# Patient Record
Sex: Female | Born: 1950 | Race: White | Hispanic: No | Marital: Married | Smoking: Former smoker
Health system: Southern US, Community
[De-identification: ages and names within clinical notes are randomized; demographics above are authoritative.]

## PROBLEM LIST (undated history)

## (undated) DIAGNOSIS — Z923 Personal history of irradiation: Secondary | ICD-10-CM

## (undated) DIAGNOSIS — S43006A Unspecified dislocation of unspecified shoulder joint, initial encounter: Secondary | ICD-10-CM

## (undated) DIAGNOSIS — E039 Hypothyroidism, unspecified: Secondary | ICD-10-CM

## (undated) DIAGNOSIS — M1990 Unspecified osteoarthritis, unspecified site: Secondary | ICD-10-CM

## (undated) DIAGNOSIS — H409 Unspecified glaucoma: Secondary | ICD-10-CM

## (undated) DIAGNOSIS — C801 Malignant (primary) neoplasm, unspecified: Secondary | ICD-10-CM

## (undated) DIAGNOSIS — D649 Anemia, unspecified: Secondary | ICD-10-CM

## (undated) DIAGNOSIS — E079 Disorder of thyroid, unspecified: Secondary | ICD-10-CM

## (undated) DIAGNOSIS — K219 Gastro-esophageal reflux disease without esophagitis: Secondary | ICD-10-CM

## (undated) DIAGNOSIS — I10 Essential (primary) hypertension: Secondary | ICD-10-CM

## (undated) DIAGNOSIS — G629 Polyneuropathy, unspecified: Secondary | ICD-10-CM

## (undated) HISTORY — DX: Disorder of thyroid, unspecified: E07.9

## (undated) HISTORY — DX: Essential (primary) hypertension: I10

## (undated) HISTORY — DX: Polyneuropathy, unspecified: G62.9

## (undated) HISTORY — DX: Unspecified glaucoma: H40.9

## (undated) HISTORY — DX: Unspecified dislocation of unspecified shoulder joint, initial encounter: S43.006A

## (undated) HISTORY — PX: COLONOSCOPY WITH PROPOFOL: SHX5780

## (undated) HISTORY — PX: BACK SURGERY: SHX140

## (undated) HISTORY — DX: Anemia, unspecified: D64.9

---
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
## 1993-01-29 HISTORY — PX: BREAST BIOPSY: SHX20

## 2004-09-19 ENCOUNTER — Ambulatory Visit: Payer: Self-pay | Admitting: Internal Medicine

## 2005-09-20 ENCOUNTER — Ambulatory Visit: Payer: Self-pay | Admitting: Internal Medicine

## 2006-09-16 ENCOUNTER — Ambulatory Visit: Payer: Self-pay | Admitting: Internal Medicine

## 2007-10-01 ENCOUNTER — Ambulatory Visit: Payer: Self-pay | Admitting: Internal Medicine

## 2008-09-27 ENCOUNTER — Ambulatory Visit: Payer: Self-pay | Admitting: Internal Medicine

## 2008-10-13 ENCOUNTER — Ambulatory Visit: Payer: Self-pay | Admitting: Internal Medicine

## 2009-08-12 ENCOUNTER — Ambulatory Visit: Payer: Self-pay | Admitting: Internal Medicine

## 2010-09-21 ENCOUNTER — Ambulatory Visit: Payer: Self-pay | Admitting: Internal Medicine

## 2010-09-26 ENCOUNTER — Ambulatory Visit: Payer: Self-pay | Admitting: Internal Medicine

## 2011-09-24 ENCOUNTER — Ambulatory Visit: Payer: Self-pay | Admitting: Internal Medicine

## 2012-09-24 ENCOUNTER — Ambulatory Visit: Payer: Self-pay | Admitting: Internal Medicine

## 2013-09-25 ENCOUNTER — Ambulatory Visit: Payer: Self-pay | Admitting: Internal Medicine

## 2013-09-30 ENCOUNTER — Ambulatory Visit: Payer: Self-pay | Admitting: Internal Medicine

## 2014-02-11 DIAGNOSIS — C4492 Squamous cell carcinoma of skin, unspecified: Secondary | ICD-10-CM

## 2014-02-11 HISTORY — DX: Squamous cell carcinoma of skin, unspecified: C44.92

## 2014-04-01 ENCOUNTER — Ambulatory Visit: Payer: Self-pay | Admitting: Internal Medicine

## 2014-05-25 ENCOUNTER — Ambulatory Visit
Admit: 2014-05-25 | Disposition: A | Discharge status: Home / Self Care | Payer: Self-pay | Attending: Internal Medicine | Admitting: Internal Medicine

## 2014-09-15 ENCOUNTER — Other Ambulatory Visit: Payer: Self-pay | Admitting: Internal Medicine

## 2014-09-15 DIAGNOSIS — N651 Disproportion of reconstructed breast: Secondary | ICD-10-CM

## 2014-09-15 DIAGNOSIS — M51369 Other intervertebral disc degeneration, lumbar region without mention of lumbar back pain or lower extremity pain: Secondary | ICD-10-CM | POA: Insufficient documentation

## 2014-09-15 DIAGNOSIS — M5136 Other intervertebral disc degeneration, lumbar region: Secondary | ICD-10-CM | POA: Insufficient documentation

## 2014-09-28 ENCOUNTER — Ambulatory Visit
Admission: RE | Admit: 2014-09-28 | Discharge: 2014-09-28 | Disposition: A | Discharge status: Home / Self Care | Payer: BLUE CROSS/BLUE SHIELD | Source: Ambulatory Visit | Attending: Internal Medicine | Admitting: Internal Medicine

## 2014-09-28 DIAGNOSIS — N651 Disproportion of reconstructed breast: Secondary | ICD-10-CM

## 2014-09-28 DIAGNOSIS — N63 Unspecified lump in breast: Secondary | ICD-10-CM | POA: Insufficient documentation

## 2014-09-28 DIAGNOSIS — N6489 Other specified disorders of breast: Secondary | ICD-10-CM | POA: Insufficient documentation

## 2015-04-13 ENCOUNTER — Other Ambulatory Visit: Payer: Self-pay | Admitting: Internal Medicine

## 2015-04-13 DIAGNOSIS — N63 Unspecified lump in unspecified breast: Secondary | ICD-10-CM

## 2015-04-29 ENCOUNTER — Other Ambulatory Visit: Payer: Self-pay | Admitting: Internal Medicine

## 2015-04-29 ENCOUNTER — Ambulatory Visit
Admission: RE | Admit: 2015-04-29 | Discharge: 2015-04-29 | Disposition: A | Discharge status: Home / Self Care | Payer: Medicare PPO | Source: Ambulatory Visit | Attending: Internal Medicine | Admitting: Internal Medicine

## 2015-04-29 DIAGNOSIS — N6489 Other specified disorders of breast: Secondary | ICD-10-CM | POA: Insufficient documentation

## 2015-04-29 DIAGNOSIS — N63 Unspecified lump in unspecified breast: Secondary | ICD-10-CM

## 2015-09-20 ENCOUNTER — Other Ambulatory Visit: Payer: Self-pay | Admitting: Internal Medicine

## 2015-09-20 DIAGNOSIS — N6310 Unspecified lump in the right breast, unspecified quadrant: Secondary | ICD-10-CM

## 2015-10-28 ENCOUNTER — Ambulatory Visit
Admission: RE | Admit: 2015-10-28 | Discharge: 2015-10-28 | Disposition: A | Discharge status: Home / Self Care | Payer: Medicare PPO | Source: Ambulatory Visit | Attending: Internal Medicine | Admitting: Internal Medicine

## 2015-10-28 DIAGNOSIS — N63 Unspecified lump in breast: Secondary | ICD-10-CM | POA: Insufficient documentation

## 2015-10-28 DIAGNOSIS — N6489 Other specified disorders of breast: Secondary | ICD-10-CM | POA: Diagnosis not present

## 2015-10-28 DIAGNOSIS — N6310 Unspecified lump in the right breast, unspecified quadrant: Secondary | ICD-10-CM

## 2016-03-20 DIAGNOSIS — D649 Anemia, unspecified: Secondary | ICD-10-CM | POA: Insufficient documentation

## 2016-03-20 DIAGNOSIS — I10 Essential (primary) hypertension: Secondary | ICD-10-CM | POA: Insufficient documentation

## 2016-03-20 DIAGNOSIS — K2100 Gastro-esophageal reflux disease with esophagitis, without bleeding: Secondary | ICD-10-CM | POA: Insufficient documentation

## 2016-05-03 ENCOUNTER — Ambulatory Visit: Admit: 2016-05-03 | Payer: BLUE CROSS/BLUE SHIELD | Admitting: Unknown Physician Specialty

## 2016-05-03 SURGERY — ESOPHAGOGASTRODUODENOSCOPY (EGD) WITH PROPOFOL
Anesthesia: General

## 2016-06-07 ENCOUNTER — Encounter: Payer: Self-pay | Admitting: Oncology

## 2016-06-07 ENCOUNTER — Inpatient Hospital Stay: Payer: Medicare PPO

## 2016-06-07 ENCOUNTER — Inpatient Hospital Stay: Payer: Medicare PPO | Attending: Oncology | Admitting: Oncology

## 2016-06-07 ENCOUNTER — Encounter (INDEPENDENT_AMBULATORY_CARE_PROVIDER_SITE_OTHER): Payer: Self-pay

## 2016-06-07 VITALS — BP 123/72 | HR 58 | Temp 97.0°F | Ht 66.0 in | Wt 159.4 lb

## 2016-06-07 DIAGNOSIS — R5383 Other fatigue: Secondary | ICD-10-CM | POA: Diagnosis not present

## 2016-06-07 DIAGNOSIS — D649 Anemia, unspecified: Secondary | ICD-10-CM

## 2016-06-07 DIAGNOSIS — M5136 Other intervertebral disc degeneration, lumbar region: Secondary | ICD-10-CM

## 2016-06-07 DIAGNOSIS — G8929 Other chronic pain: Secondary | ICD-10-CM | POA: Diagnosis not present

## 2016-06-07 DIAGNOSIS — Z87891 Personal history of nicotine dependence: Secondary | ICD-10-CM | POA: Insufficient documentation

## 2016-06-07 DIAGNOSIS — H409 Unspecified glaucoma: Secondary | ICD-10-CM | POA: Insufficient documentation

## 2016-06-07 DIAGNOSIS — K648 Other hemorrhoids: Secondary | ICD-10-CM | POA: Diagnosis not present

## 2016-06-07 DIAGNOSIS — I10 Essential (primary) hypertension: Secondary | ICD-10-CM | POA: Insufficient documentation

## 2016-06-07 DIAGNOSIS — Z79899 Other long term (current) drug therapy: Secondary | ICD-10-CM | POA: Diagnosis not present

## 2016-06-07 DIAGNOSIS — Z7982 Long term (current) use of aspirin: Secondary | ICD-10-CM | POA: Diagnosis not present

## 2016-06-07 DIAGNOSIS — R7989 Other specified abnormal findings of blood chemistry: Secondary | ICD-10-CM | POA: Insufficient documentation

## 2016-06-07 DIAGNOSIS — M549 Dorsalgia, unspecified: Secondary | ICD-10-CM

## 2016-06-07 DIAGNOSIS — Z803 Family history of malignant neoplasm of breast: Secondary | ICD-10-CM | POA: Diagnosis not present

## 2016-06-07 DIAGNOSIS — N6019 Diffuse cystic mastopathy of unspecified breast: Secondary | ICD-10-CM | POA: Insufficient documentation

## 2016-06-07 DIAGNOSIS — G629 Polyneuropathy, unspecified: Secondary | ICD-10-CM | POA: Diagnosis not present

## 2016-06-07 DIAGNOSIS — K5790 Diverticulosis of intestine, part unspecified, without perforation or abscess without bleeding: Secondary | ICD-10-CM | POA: Insufficient documentation

## 2016-06-07 DIAGNOSIS — R5381 Other malaise: Secondary | ICD-10-CM | POA: Insufficient documentation

## 2016-06-07 DIAGNOSIS — K210 Gastro-esophageal reflux disease with esophagitis: Secondary | ICD-10-CM | POA: Diagnosis not present

## 2016-06-07 LAB — URINALYSIS, COMPLETE (UACMP) WITH MICROSCOPIC
BILIRUBIN URINE: NEGATIVE
Bacteria, UA: NONE SEEN
GLUCOSE, UA: NEGATIVE mg/dL
HGB URINE DIPSTICK: NEGATIVE
Ketones, ur: NEGATIVE mg/dL
NITRITE: NEGATIVE
PH: 7 (ref 5.0–8.0)
Protein, ur: NEGATIVE mg/dL
SPECIFIC GRAVITY, URINE: 1.003 — AB (ref 1.005–1.030)

## 2016-06-07 LAB — CBC WITH DIFFERENTIAL/PLATELET
BASOS ABS: 0 10*3/uL (ref 0–0.1)
BASOS PCT: 1 %
Eosinophils Absolute: 0.4 10*3/uL (ref 0–0.7)
Eosinophils Relative: 10 %
HEMATOCRIT: 32 % — AB (ref 35.0–47.0)
HEMOGLOBIN: 10.8 g/dL — AB (ref 12.0–16.0)
Lymphocytes Relative: 30 %
Lymphs Abs: 1.1 10*3/uL (ref 1.0–3.6)
MCH: 31.3 pg (ref 26.0–34.0)
MCHC: 33.8 g/dL (ref 32.0–36.0)
MCV: 92.6 fL (ref 80.0–100.0)
Monocytes Absolute: 0.3 10*3/uL (ref 0.2–0.9)
Monocytes Relative: 7 %
NEUTROS ABS: 1.9 10*3/uL (ref 1.4–6.5)
NEUTROS PCT: 52 %
Platelets: 245 10*3/uL (ref 150–440)
RBC: 3.45 MIL/uL — AB (ref 3.80–5.20)
RDW: 13.4 % (ref 11.5–14.5)
WBC: 3.7 10*3/uL (ref 3.6–11.0)

## 2016-06-07 LAB — COMPREHENSIVE METABOLIC PANEL
ALBUMIN: 4.4 g/dL (ref 3.5–5.0)
ALT: 27 U/L (ref 14–54)
AST: 23 U/L (ref 15–41)
Alkaline Phosphatase: 67 U/L (ref 38–126)
Anion gap: 5 (ref 5–15)
BILIRUBIN TOTAL: 0.6 mg/dL (ref 0.3–1.2)
BUN: 26 mg/dL — ABNORMAL HIGH (ref 6–20)
CO2: 29 mmol/L (ref 22–32)
Calcium: 9.7 mg/dL (ref 8.9–10.3)
Chloride: 101 mmol/L (ref 101–111)
Creatinine, Ser: 1.12 mg/dL — ABNORMAL HIGH (ref 0.44–1.00)
GFR calc non Af Amer: 50 mL/min — ABNORMAL LOW (ref >60)
GFR, EST AFRICAN AMERICAN: 58 mL/min — AB (ref >60)
GLUCOSE: 91 mg/dL (ref 65–99)
POTASSIUM: 4.3 mmol/L (ref 3.5–5.1)
Sodium: 135 mmol/L (ref 135–145)
TOTAL PROTEIN: 7.7 g/dL (ref 6.5–8.1)

## 2016-06-07 LAB — FERRITIN: Ferritin: 32 ng/mL (ref 11–307)

## 2016-06-07 LAB — IRON AND TIBC
IRON: 108 ug/dL (ref 28–170)
SATURATION RATIOS: 27 % (ref 10.4–31.8)
TIBC: 405 ug/dL (ref 250–450)
UIBC: 297 ug/dL

## 2016-06-07 LAB — TSH: TSH: 2.876 u[IU]/mL (ref 0.350–4.500)

## 2016-06-07 LAB — RETICULOCYTES
RBC.: 3.52 MIL/uL — AB (ref 3.80–5.20)
RETIC COUNT ABSOLUTE: 52.8 10*3/uL (ref 19.0–183.0)
RETIC CT PCT: 1.5 % (ref 0.4–3.1)

## 2016-06-07 LAB — SEDIMENTATION RATE: Sed Rate: 22 mm/hr (ref 0–30)

## 2016-06-07 LAB — VITAMIN B12: Vitamin B-12: 1087 pg/mL — ABNORMAL HIGH (ref 180–914)

## 2016-06-07 LAB — FOLATE: FOLATE: 14.5 ng/mL (ref >5.9)

## 2016-06-07 NOTE — Progress Notes (Signed)
Patient here for initial visit. Patient has experienced intermittent  Tiredness.

## 2016-06-07 NOTE — Progress Notes (Signed)
Hematology/Oncology Consult note [facility] Telephone:([phone] [phone]  No care team member to display   Name of the patient: Megan Briggs  [id number]  Sep 18, 1950    Reason for referral- anemia   Referring physician- Dr. Emily Filbert  Date of visit: 06/07/16   History of presenting illness- patient is a 66 year old female who was been referred to [place] for evaluation and management of anemia. Most recent CBC from 03/12/2016 showed white count of 3.7, H&H of 10.8/33.5 and platelet count is 230.Marland Kitchen Ferritin was low normal at 54. Folate was normal at 11.3. CMP was significant for creatinine of 1.3. She does [facility] and she has had a colonoscopy in 2004 as well as 2014 which showed diverticulosis and internal hemorrhoids. Patient states that her iron studies were done after she started taking oral iron as there was concern for iron deficiency anemia. Off note her MCV has always been normocytic. Her hemoglobin has always been between 10-11 since 2015 and her ferritin has been between 25-35 previously. She recently had egd which showed gastritis. Biopsies were negative. Patient has been off iron for about 2 months now. 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   ECOG PS- 0  Pain scale- 0   Review of systems- Review of Systems  Constitutional: Negative for chills, fever, malaise/fatigue and weight loss.  HENT: Negative for congestion, ear discharge and nosebleeds.   Eyes: Negative for blurred vision.  Respiratory: Negative for cough, hemoptysis, sputum production, shortness of breath and wheezing.   Cardiovascular: Negative for chest pain, palpitations, orthopnea and claudication.  Gastrointestinal: Negative for abdominal pain, blood in stool, constipation, diarrhea, heartburn, melena, nausea and vomiting.  Genitourinary: Negative for dysuria, flank pain, frequency, hematuria and urgency.    Musculoskeletal: Negative for back pain, joint pain and myalgias.  Skin: Negative for rash.  Neurological: Negative for dizziness, tingling, focal weakness, seizures, weakness and headaches.  Endo/Heme/Allergies: Does not bruise/bleed easily.  Psychiatric/Behavioral: Negative for depression and suicidal ideas. The patient does not have insomnia.     Allergies no known allergies  Patient Active Problem List   Diagnosis Date Noted  . Fibrocystic breast disease 06/07/2016  . Benign essential hypertension 03/20/2016  . Chronic anemia 03/20/2016  . Esophagitis, reflux 03/20/2016  . Degenerative disc disease, lumbar 09/15/2014     Past Medical History:  Diagnosis Date  . Anemia   . Glaucoma   . Hypertension   . Neuropathy      Past Surgical History:  Procedure Laterality Date  . BACK SURGERY    . BREAST BIOPSY Right 1995   benign    Social History   Social History  . Marital status: Married    Spouse name: N/A  . Number of children: N/A  . Years of education: N/A   Occupational History  . Not on file.   Social History Main Topics  . Smoking status: Former Smoker    Packs/day: 0.25    Quit date: 06/08/1970  . Smokeless tobacco: Never Used  . Alcohol use 1.8 oz/week    3 Glasses of wine per week  . Drug use: No  . Sexual activity: Not on file   Other Topics Concern  . Not on file   Social History Narrative  . No narrative on file     Family History  Problem Relation Age of Onset  . Breast cancer Cousin   . Breast cancer Cousin   .  Breast cancer Paternal Aunt      Current Outpatient Prescriptions:  .  acetaminophen (TYLENOL) 500 MG tablet, Take by mouth., Disp: , Rfl:  .  aspirin 81 MG chewable tablet, Chew by mouth., Disp: , Rfl:  .  brimonidine-timolol (COMBIGAN) 0.2-0.5 % ophthalmic solution, Place 1 drop into both eyes every 12 (twelve) hours., Disp: , Rfl:  .  gabapentin (NEURONTIN) 300 MG capsule, Take by mouth., Disp: , Rfl:  .   lisinopril-hydrochlorothiazide (PRINZIDE,ZESTORETIC) 20-12.5 MG tablet, , Disp: , Rfl:  .  meloxicam (MOBIC) 7.5 MG tablet, , Disp: , Rfl:  .  mometasone (ELOCON) 0.1 % cream, , Disp: , Rfl:  .  pantoprazole (PROTONIX) 40 MG tablet, , Disp: , Rfl:  .  polyethylene glycol powder (GLYCOLAX/MIRALAX) powder, , Disp: , Rfl:  .  RA VITAMIN E-VIT A & D 20000 units CREA, , Disp: , Rfl:  .  sucralfate (CARAFATE) 1 g tablet, , Disp: , Rfl:  .  TRAVATAN Z 0.004 % SOLN ophthalmic solution, , Disp: , Rfl:  .  vitamin B-12 (CYANOCOBALAMIN) 1000 MCG tablet, , Disp: , Rfl:    Physical exam:  Vitals:   06/07/16 1437  BP: 123/72  Pulse: (!) 58  Temp: 97 F (36.1 C)  TempSrc: Tympanic  SpO2: 99%  Weight: 159 lb 6.4 oz (72.3 kg)  Height: '5\' 6"'$  (1.676 m)   Physical Exam  Constitutional: She is oriented to person, place, and time and well-developed, well-nourished, and in no distress.  HENT:  Head: Normocephalic and atraumatic.  Eyes: EOM are normal. Pupils are equal, round, and reactive to light.  Neck: Normal range of motion.  Cardiovascular: Normal rate, regular rhythm and normal heart sounds.   Pulmonary/Chest: Effort normal and breath sounds normal.  Abdominal: Soft. Bowel sounds are normal.  Neurological: She is alert and oriented to person, place, and time.  Skin: Skin is warm and dry.  2-3 areas of bruising over left forearm from minor injury        Assessment and plan- Patient is a 66 y.o. female referred to [place] for normocytic anemia  Today I will obtain CBC with differential, CMP, ferritin and iron studies, B12, folate, ESR, TSH, reticulocyte count, haptoglobin and multiple myeloma panel. I will also check stool H pylori ag, cleiac panel and UA.  I will see the patient back in 2 weeks' time to discuss the results of her blood work and further management.   Thank you for this kind referral and the opportunity to participate in the care of this patient   Visit Diagnosis 1.  Normocytic anemia     Dr. Randa Evens, MD, MPH [facility] at [facility] Pager- [phone] 06/07/2016 3:27 PM

## 2016-06-08 ENCOUNTER — Other Ambulatory Visit: Payer: Self-pay | Admitting: *Deleted

## 2016-06-08 LAB — H. PYLORI ANTIBODY, IGG: H Pylori IgG: <0.8 Index Value (ref 0.00–0.79)

## 2016-06-08 LAB — HAPTOGLOBIN: Haptoglobin: 128 mg/dL (ref 34–200)

## 2016-06-11 LAB — MULTIPLE MYELOMA PANEL, SERUM
ALPHA2 GLOB SERPL ELPH-MCNC: 0.6 g/dL (ref 0.4–1.0)
Albumin SerPl Elph-Mcnc: 4.3 g/dL (ref 2.9–4.4)
Albumin/Glob SerPl: 1.7 (ref 0.7–1.7)
Alpha 1: 0.2 g/dL (ref 0.0–0.4)
B-GLOBULIN SERPL ELPH-MCNC: 0.9 g/dL (ref 0.7–1.3)
Gamma Glob SerPl Elph-Mcnc: 0.9 g/dL (ref 0.4–1.8)
Globulin, Total: 2.6 g/dL (ref 2.2–3.9)
IGG (IMMUNOGLOBIN G), SERUM: 869 mg/dL (ref 700–1600)
IGM, SERUM: 108 mg/dL (ref 26–217)
IgA: 119 mg/dL (ref 87–352)
Total Protein ELP: 6.9 g/dL (ref 6.0–8.5)

## 2016-06-21 ENCOUNTER — Inpatient Hospital Stay (HOSPITAL_BASED_OUTPATIENT_CLINIC_OR_DEPARTMENT_OTHER): Payer: Medicare PPO | Admitting: Oncology

## 2016-06-21 VITALS — BP 117/71 | HR 68 | Temp 97.5°F | Wt 156.6 lb

## 2016-06-21 DIAGNOSIS — I10 Essential (primary) hypertension: Secondary | ICD-10-CM

## 2016-06-21 DIAGNOSIS — G8929 Other chronic pain: Secondary | ICD-10-CM

## 2016-06-21 DIAGNOSIS — K5790 Diverticulosis of intestine, part unspecified, without perforation or abscess without bleeding: Secondary | ICD-10-CM

## 2016-06-21 DIAGNOSIS — K648 Other hemorrhoids: Secondary | ICD-10-CM | POA: Diagnosis not present

## 2016-06-21 DIAGNOSIS — R7989 Other specified abnormal findings of blood chemistry: Secondary | ICD-10-CM | POA: Diagnosis not present

## 2016-06-21 DIAGNOSIS — K210 Gastro-esophageal reflux disease with esophagitis: Secondary | ICD-10-CM | POA: Diagnosis not present

## 2016-06-21 DIAGNOSIS — M5136 Other intervertebral disc degeneration, lumbar region: Secondary | ICD-10-CM

## 2016-06-21 DIAGNOSIS — Z87891 Personal history of nicotine dependence: Secondary | ICD-10-CM

## 2016-06-21 DIAGNOSIS — Z7982 Long term (current) use of aspirin: Secondary | ICD-10-CM

## 2016-06-21 DIAGNOSIS — Z79899 Other long term (current) drug therapy: Secondary | ICD-10-CM

## 2016-06-21 DIAGNOSIS — D649 Anemia, unspecified: Secondary | ICD-10-CM

## 2016-06-21 DIAGNOSIS — G629 Polyneuropathy, unspecified: Secondary | ICD-10-CM | POA: Diagnosis not present

## 2016-06-21 DIAGNOSIS — R5381 Other malaise: Secondary | ICD-10-CM | POA: Diagnosis not present

## 2016-06-21 DIAGNOSIS — H409 Unspecified glaucoma: Secondary | ICD-10-CM | POA: Diagnosis not present

## 2016-06-21 DIAGNOSIS — M549 Dorsalgia, unspecified: Secondary | ICD-10-CM

## 2016-06-21 NOTE — Progress Notes (Signed)
Patient denies pain or discomfort at this time, vitals stable and documented.  Patient ambulates without assistance and brought to exam room 5.

## 2016-06-22 ENCOUNTER — Encounter: Payer: Self-pay | Admitting: Oncology

## 2016-06-22 NOTE — Progress Notes (Signed)
Hematology/Oncology Consult note [facility]  Telephone:([phone] Fax:([phone]  Patient Care Team: Rusty Aus, MD as PCP - General (Internal Medicine)   Name of the patient: Megan Briggs  [id number]  11-21-1950   Date of visit: 06/22/16  Diagnosis- normocytic anemia of unclear etiology  Chief complaint/ Reason for visit- discuss results of bloodwork  Heme/Onc history: patient is a 66 year old female who was been referred to [place] for evaluation and management of anemia. Most recent CBC from 03/12/2016 showed white count of 3.7, H&H of 10.8/33.5 and platelet count is 230.Marland Kitchen Ferritin was low normal at 54. Folate was normal at 11.3. CMP was significant for creatinine of 1.3. She does [facility] and she has had a colonoscopy in 2004 as well as 2014 which showed diverticulosis and internal hemorrhoids. Patient states that her iron studies were done after she started taking oral iron as there was concern for iron deficiency anemia. Off note her MCV has always been normocytic. Her hemoglobin has always been between 10-11 since 2015 and her ferritin has been between 25-35 previously. She recently had egd which showed gastritis. Biopsies were negative. Patient has been off iron for about 2 months now. 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   Results of blood work from 06/07/2016 were as follows: CBC showed white count of 3.7, H&H of 10.8/32 with an MCV of 92.6 and a platelet count of 245. CMP was normal except for mildly elevated creatinine of 1.1. Ferritin was low normal at 32 with iron studies were normal including an iron saturation of 27%. Urinalysis was negative for hematuria. B12 was elevated at 1087 patient takes oral B12 supplements). Folate and TSH was within normal limits. ESR was normal at 22. Multiple myeloma panel did not reveal any monoclonal gammopathy. Haptoglobin was normal at 128.   Interval  history- Patient reports mild fatigue. Denies other complaints  ECOG PS- 1 Pain scale- 0   Review of systems- Review of Systems  Constitutional: Positive for malaise/fatigue. Negative for chills, fever and weight loss.  HENT: Negative for congestion, ear discharge and nosebleeds.   Eyes: Negative for blurred vision.  Respiratory: Negative for cough, hemoptysis, sputum production, shortness of breath and wheezing.   Cardiovascular: Negative for chest pain, palpitations, orthopnea and claudication.  Gastrointestinal: Negative for abdominal pain, blood in stool, constipation, diarrhea, heartburn, melena, nausea and vomiting.  Genitourinary: Negative for dysuria, flank pain, frequency, hematuria and urgency.  Musculoskeletal: Negative for back pain, joint pain and myalgias.  Skin: Negative for rash.  Neurological: Negative for dizziness, tingling, focal weakness, seizures, weakness and headaches.  Endo/Heme/Allergies: Does not bruise/bleed easily.  Psychiatric/Behavioral: Negative for depression and suicidal ideas. The patient does not have insomnia.      Current treatment- observation  No Known Allergies   Past Medical History:  Diagnosis Date  . Anemia   . Glaucoma   . Hypertension   . Neuropathy      Past Surgical History:  Procedure Laterality Date  . BACK SURGERY    . BREAST BIOPSY Right 1995   benign    Social History   Social History  . Marital status: Married    Spouse name: N/A  . Number of children: N/A  . Years of education: N/A   Occupational History  . Not on file.   Social History Main Topics  . Smoking status: Former Smoker    Packs/day: 0.25  Quit date: 06/08/1970  . Smokeless tobacco: Never Used  . Alcohol use 1.8 oz/week    3 Glasses of wine per week  . Drug use: No  . Sexual activity: Not on file   Other Topics Concern  . Not on file   Social History Narrative  . No narrative on file    Family History  Problem Relation Age of  Onset  . Breast cancer Cousin   . Breast cancer Cousin   . Breast cancer Paternal Aunt      Current Outpatient Prescriptions:  .  acetaminophen (TYLENOL) 500 MG tablet, Take by mouth., Disp: , Rfl:  .  brimonidine-timolol (COMBIGAN) 0.2-0.5 % ophthalmic solution, Place 1 drop into both eyes every 12 (twelve) hours., Disp: , Rfl:  .  gabapentin (NEURONTIN) 300 MG capsule, Take by mouth., Disp: , Rfl:  .  latanoprost (XALATAN) 0.005 % ophthalmic solution, , Disp: , Rfl:  .  lisinopril-hydrochlorothiazide (PRINZIDE,ZESTORETIC) 20-12.5 MG tablet, , Disp: , Rfl:  .  meloxicam (MOBIC) 7.5 MG tablet, , Disp: , Rfl:  .  mometasone (ELOCON) 0.1 % cream, , Disp: , Rfl:  .  pantoprazole (PROTONIX) 40 MG tablet, , Disp: , Rfl:  .  polyethylene glycol powder (GLYCOLAX/MIRALAX) powder, , Disp: , Rfl:  .  RA VITAMIN E-VIT A & D 20000 units CREA, , Disp: , Rfl:  .  sucralfate (CARAFATE) 1 g tablet, , Disp: , Rfl:  .  TRAVATAN Z 0.004 % SOLN ophthalmic solution, , Disp: , Rfl:  .  vitamin B-12 (CYANOCOBALAMIN) 1000 MCG tablet, , Disp: , Rfl:  .  aspirin 81 MG chewable tablet, Chew by mouth., Disp: , Rfl:   Physical exam:  Vitals:   06/21/16 1531  BP: 117/71  Pulse: 68  Temp: 97.5 F (36.4 C)  TempSrc: Tympanic  Weight: 156 lb 9.6 oz (71 kg)   Physical Exam  Constitutional: She is oriented to person, place, and time and well-developed, well-nourished, and in no distress.  HENT:  Head: Normocephalic and atraumatic.  Eyes: EOM are normal. Pupils are equal, round, and reactive to light.  Neck: Normal range of motion.  Cardiovascular: Normal rate, regular rhythm and normal heart sounds.   Pulmonary/Chest: Effort normal and breath sounds normal.  Abdominal: Soft. Bowel sounds are normal.  Neurological: She is alert and oriented to person, place, and time.  Skin: Skin is warm and dry.     CMP Latest Ref Rng & Units 06/07/2016  Glucose 65 - 99 mg/dL 91  BUN 6 - 20 mg/dL 26(H)  Creatinine 0.44  - 1.00 mg/dL 1.12(H)  Sodium 135 - 145 mmol/L 135  Potassium 3.5 - 5.1 mmol/L 4.3  Chloride 101 - 111 mmol/L 101  CO2 22 - 32 mmol/L 29  Calcium 8.9 - 10.3 mg/dL 9.7  Total Protein 6.5 - 8.1 g/dL 7.7  Total Bilirubin 0.3 - 1.2 mg/dL 0.6  Alkaline Phos 38 - 126 U/L 67  AST 15 - 41 U/L 23  ALT 14 - 54 U/L 27   CBC Latest Ref Rng & Units 06/07/2016  WBC 3.6 - 11.0 K/uL 3.7  Hemoglobin 12.0 - 16.0 g/dL 10.8(L)  Hematocrit 35.0 - 47.0 % 32.0(L)  Platelets 150 - 440 K/uL 245     Assessment and plan- Patient is a 66 y.o. female referred to [place] for normocytic anemia of unclear etiology  I reviewed the results of anemia work up with the patient which are essentially unremarakable and there is no overt cause of  anemia evident on work up. She does have a low normal retic count suggestive of hypoproliferative anemia which could be from a bone marrow issue. Her hemoglobin has been stable between 10.8 to 11.8 over last 3 years. At this point I'm inclined to monitor this conservatively without doing a bone marrow biopsy. She will have repeat CBC checked with Dr. Sabra Heck at 3 months time which we will review. I will see her back in 6 months with repeat CBC and iron studies. If her hemoglobin were to consistently go down with time, I will consider a bone marrow biopsy. She has isolated anemia in the absence of a low white count and thrombocytopenia.    Visit Diagnosis 1. Normocytic anemia      Dr. Randa Evens, MD, MPH [facility] at [facility] Pager- [phone] 06/22/2016 11:52 AM

## 2016-07-13 ENCOUNTER — Other Ambulatory Visit: Payer: Self-pay | Admitting: Internal Medicine

## 2016-07-13 DIAGNOSIS — Z1231 Encounter for screening mammogram for malignant neoplasm of breast: Secondary | ICD-10-CM

## 2016-09-04 DIAGNOSIS — E782 Mixed hyperlipidemia: Secondary | ICD-10-CM | POA: Insufficient documentation

## 2016-10-29 ENCOUNTER — Ambulatory Visit
Admission: RE | Admit: 2016-10-29 | Discharge: 2016-10-29 | Disposition: A | Discharge status: Home / Self Care | Payer: Medicare PPO | Source: Ambulatory Visit | Attending: Internal Medicine | Admitting: Internal Medicine

## 2016-10-29 DIAGNOSIS — Z1231 Encounter for screening mammogram for malignant neoplasm of breast: Secondary | ICD-10-CM | POA: Insufficient documentation

## 2016-12-27 ENCOUNTER — Other Ambulatory Visit: Payer: Medicare PPO

## 2016-12-27 ENCOUNTER — Ambulatory Visit: Payer: Medicare PPO | Admitting: Oncology

## 2017-09-20 ENCOUNTER — Other Ambulatory Visit: Payer: Self-pay | Admitting: Internal Medicine

## 2017-09-20 DIAGNOSIS — Z1231 Encounter for screening mammogram for malignant neoplasm of breast: Secondary | ICD-10-CM

## 2017-10-09 ENCOUNTER — Ambulatory Visit
Admission: RE | Admit: 2017-10-09 | Discharge: 2017-10-09 | Disposition: A | Discharge status: Home / Self Care | Payer: Medicare PPO | Source: Ambulatory Visit | Attending: Internal Medicine | Admitting: Internal Medicine

## 2017-10-09 DIAGNOSIS — Z1231 Encounter for screening mammogram for malignant neoplasm of breast: Secondary | ICD-10-CM

## 2017-10-15 ENCOUNTER — Other Ambulatory Visit: Payer: Self-pay | Admitting: Internal Medicine

## 2017-10-15 DIAGNOSIS — R928 Other abnormal and inconclusive findings on diagnostic imaging of breast: Secondary | ICD-10-CM

## 2017-10-16 ENCOUNTER — Ambulatory Visit
Admission: RE | Admit: 2017-10-16 | Discharge: 2017-10-16 | Disposition: A | Discharge status: Home / Self Care | Payer: Medicare PPO | Source: Ambulatory Visit | Attending: Internal Medicine | Admitting: Internal Medicine

## 2017-10-16 DIAGNOSIS — R928 Other abnormal and inconclusive findings on diagnostic imaging of breast: Secondary | ICD-10-CM

## 2017-10-21 ENCOUNTER — Other Ambulatory Visit: Payer: Self-pay | Admitting: Internal Medicine

## 2017-10-21 DIAGNOSIS — N6320 Unspecified lump in the left breast, unspecified quadrant: Secondary | ICD-10-CM

## 2017-10-21 DIAGNOSIS — R928 Other abnormal and inconclusive findings on diagnostic imaging of breast: Secondary | ICD-10-CM

## 2017-10-29 DIAGNOSIS — Z923 Personal history of irradiation: Secondary | ICD-10-CM

## 2017-10-29 HISTORY — DX: Personal history of irradiation: Z92.3

## 2017-11-01 ENCOUNTER — Ambulatory Visit
Admission: RE | Admit: 2017-11-01 | Discharge: 2017-11-01 | Disposition: A | Discharge status: Home / Self Care | Payer: Medicare PPO | Source: Ambulatory Visit | Attending: Internal Medicine | Admitting: Internal Medicine

## 2017-11-01 DIAGNOSIS — R928 Other abnormal and inconclusive findings on diagnostic imaging of breast: Secondary | ICD-10-CM | POA: Diagnosis present

## 2017-11-01 DIAGNOSIS — N6320 Unspecified lump in the left breast, unspecified quadrant: Secondary | ICD-10-CM

## 2017-11-01 HISTORY — PX: BREAST BIOPSY: SHX20

## 2017-11-04 ENCOUNTER — Other Ambulatory Visit: Payer: Self-pay | Admitting: Internal Medicine

## 2017-11-04 LAB — SURGICAL PATHOLOGY

## 2017-11-05 ENCOUNTER — Encounter: Payer: Self-pay | Admitting: *Deleted

## 2017-11-05 NOTE — Progress Notes (Signed)
  Oncology Nurse Navigator Documentation  Navigator Location: [facility] (11/05/17 1500)   )Navigator Encounter Type: Introductory phone call (11/05/17 1500)   Abnormal Finding Date: 10/16/17 (11/05/17 1500) Confirmed Diagnosis Date: 11/01/17 (11/05/17 1500)                   Barriers/Navigation Needs: Coordination of Care (11/05/17 1500)                          Time Spent with Patient: 15 (11/05/17 1500)   Patient is newly diagnose with invasive breast cancer.  She is scheduled to see Dr. Lysle Pearl on 11/06/17.  I would like to schedule medical oncology per protocol.  Left message for patient to return my call.

## 2017-11-06 ENCOUNTER — Other Ambulatory Visit: Payer: Self-pay | Admitting: *Deleted

## 2017-11-06 DIAGNOSIS — C50919 Malignant neoplasm of unspecified site of unspecified female breast: Secondary | ICD-10-CM

## 2017-11-11 ENCOUNTER — Other Ambulatory Visit: Payer: Self-pay | Admitting: Surgery

## 2017-11-11 ENCOUNTER — Ambulatory Visit: Payer: Self-pay | Admitting: Surgery

## 2017-11-11 DIAGNOSIS — C50412 Malignant neoplasm of upper-outer quadrant of left female breast: Secondary | ICD-10-CM

## 2017-11-11 NOTE — H&P (Signed)
Subjective:   CC: Malignant neoplasm of upper-outer quadrant of left female breast, unspecified estrogen receptor status (CMS-HCC) [C50.412] HPI:  Megan Briggs is a 67 y.o. female who was referred by Megan Pax, MD for evaluation of above. Change was noted on last screening mammogram. Patient does routinely do self breast exams.  Patient has not noted a change on breast exam. Age of menarche was 33. Age of menopause was 1. Patient admits to hormonal therapy. Ten years. Patient is G1P1. Age of first live birth was 68. Patient did not breast feed. Patient denies nipple discharge. Patient denies previous breast biopsy. Patient denies a personal history of breast cancer.   Past Medical History:  has a past medical history of Fibrocystic breast disease and HTN (hypertension) (08/18/2013).  Past Surgical History:  has a past surgical history that includes Hx of breast biopsy (1994); Laminectomy Lumbar Spine (L5-S1 2016); Colonoscopy (05/22/2012, 04/17/2002); egd (05/03/2016); and Breast excisional biopsy (Left, 10/2017).last cscope in 2016.  Family History: family history includes Coronary Artery Disease (Blocked arteries around heart) in her father; Stroke in her other.  Patenal aunt 66s, cousins 64s/30s/40s, NHL and lung CA sister, pancreatic CA father.  Social History:  reports that she quit smoking about 47 years ago. She has never used smokeless tobacco. She reports that she drinks alcohol. Her drug history is not on file.  Current Medications: has a current medication list which includes the following prescription(s): acetaminophen, azelastine, bimatoprost, cholecalciferol, cyanocobalamin, docusate, gabapentin, loratadine, meloxicam, mometasone, olmesartan-hydrochlorothiazide, pantoprazole, polyethylene glycol, timolol maleate, triamcinolone, and turmeric (bulk).  Allergies:       Allergies as of 11/06/2017 - Reviewed 11/06/2017  Allergen Reaction Noted  . Ace inhibitors  Angioedema 05/24/2017    ROS:  A 15 point review of systems was performed and was negative except as noted in HPI   Objective:   BP 149/76   Pulse 62   Temp 37.2 C (99 F) (Oral)   Ht 165.1 cm (_0 )   Wt 71 kg (156 lb 8.4 oz)   BMI 26.05 kg/m   Constitutional :  alert, appears stated age, cooperative and no distress  Lymphatics/Throat:  no asymmetry, masses, or scars  Respiratory:  clear to auscultation bilaterally  Cardiovascular:  regular rate and rhythm  Gastrointestinal: soft, non-tender; bowel sounds normal; no masses,  no organomegaly.   Musculoskeletal: Steady gait and movement  Skin: Cool and moist  Psychiatric: Normal affect, non-agitated, not confused  Breast:  Chaperone present for exam.  breasts appear normal, no suspicious masses, no skin or nipple changes or axillary nodes.    LABS:  Surgical Pathology CASE: [id number] PATIENT: Megan Briggs Surgical Pathology Report     SPECIMEN SUBMITTED: A. Breast, left  CLINICAL HISTORY: Screening detected 6 mm mass with architectural distortion left UOQ  PRE-OPERATIVE DIAGNOSIS: IMC  POST-OPERATIVE DIAGNOSIS: Ribbon shaped tissue marker clip was deployed     DIAGNOSIS: A. BREAST, LEFT 2:30 5 CM FN; ULTRASOUND-GUIDED BIOPSY: - INVASIVE MAMMARY CARCINOMA, NO SPECIAL TYPE.  Size of invasive carcinoma: 1 mm in this sample Histologic grade of invasive carcinoma: Grade 1  Glandular/tubular differentiation score: 1  Nuclear pleomorphism score: 2  Mitotic rate score: 1  Total score: 4 Ductal carcinoma in situ: Not identified Lymphovascular invasion: Not identified  ER/PR/HER2: Immunohistochemistry will be deferred to an excision specimen as the carcinoma gets smaller on deeper levels.  Comment: The definitive grade will be assigned on the excisional specimen. These findings were communicated to [facility] in Dr.  Lawrence's office on 11/04/2017. Read back procedure was performed.  GROSS DESCRIPTION: A. Labeled: Left breast 230, 5 cm from nipple Received: In formalin Time/date in fixative: 8:45 AM on 11/01/2017 Cold ischemic time: Less than 1 minutes Total fixation time: 11.25 hours Core pieces: Multiple Size: Aggregate, 1.5 x 0.4 x 0.1 cm Description: Yellow-red cores and fragments Ink color: Blue Entirely submitted in one cassette.    Final Diagnosis performed by Megan Burow, MD. Electronically signed 11/04/2017 12:28:29PM The electronic signature indicates that the named Attending Pathologist has evaluated the specimen  Technical component performed at [facility], [address], [place], [place] [address] Lab: [phone] Dir: Megan Farmer, MD, MMMProfessional component performed at [facility], [facility], [place], [place], [place] [address] Lab: [phone] Dir: Megan Nims. Reuel Derby, MD      RADS: CLINICAL DATA:Screening recall for a left breast distortion.  EXAM: DIGITAL DIAGNOSTIC LEFT MAMMOGRAM WITH CAD AND TOMO  ULTRASOUND LEFT BREAST  COMPARISON:Previous exam(s).  ACR Breast Density Category c: The breast tissue is heterogeneously dense, which may obscure small masses.  FINDINGS: In the upper-outer aspect of the left breast, posterior depth there is a small mass measuring approximately 6 mm associated with calcifications and distortion.  Mammographic images were processed with CAD.  On physical exam, no suspicious palpable masses are identified in the lateral aspect of the left breast.  Ultrasound of the left breast at 230, 5 cm from the nipple demonstrates an irregular hypoechoic shadowing mass which is taller than wide measuring 6 x 5 x 4 mm. No blood flow seen within the mass on color Doppler imaging. Multiple normal-appearing lymph nodes are identified in the left axilla.  IMPRESSION: 1.There is a suspicious 6 mm mass in the  left breast at 2:30.  2.No evidence of left axillary lymphadenopathy.  RECOMMENDATION: 1. Ultrasound-guided biopsy is recommended for the left breast mass at 2:30. The patient is going on vacation tomorrow for the next 2 weeks. Per her request, she would like to schedule the biopsy after October 3rd.  I have discussed the findings and recommendations with the patient. Results were also provided in writing at the conclusion of the visit. If applicable, a reminder letter will be sent to the patient regarding the next appointment.  BI-RADS CATEGORY5: Highly suggestive of malignancy.   Electronically Signed By: MichelleCollins M.D. On: 10/16/2017 16:00  Assessment:   Malignant neoplasm of upper-outer quadrant of left female breast, unspecified estrogen receptor status (CMS-HCC) [C50.412]  Plan:   1. Malignant neoplasm of upper-outer quadrant of left female breast, unspecified estrogen receptor status (CMS-HCC) [C50.412]  Discussed the risk of surgery including recurrence, chronic pain, post-op infxn, poor/delayed wound healing, poor cosmesis, seroma, hematoma formation, and possible re-operation to address said risks. The risks of general anesthetic, if used, includes MI, CVA, sudden death or even reaction to anesthetic medications also discussed.  Typical post-op recovery time and possbility of activity restrictions were also discussed.  Alternatives include continued observation.  Benefits include possible symptom relief, pathologic evaluation, and/or curative excision.   The patient verbalized understanding and all questions were answered to the patient's satisfaction.  2. Patient has elected to proceed with surgical treatment. Procedure will be scheduled.  Written consent was obtained..     Electronically signed by Benjamine Sprague, DO on 11/08/2017 12:01 PM

## 2017-11-11 NOTE — H&P (View-Only) (Signed)
Subjective:   CC: Malignant neoplasm of upper-outer quadrant of left female breast, unspecified estrogen receptor status (CMS-HCC) [C50.412] HPI:  Megan Briggs is a 67 y.o. female who was referred by Megan Frederic Miller, MD for evaluation of above. Change was noted on last screening mammogram. Patient does routinely do self breast exams.  Patient has not noted a change on breast exam. Age of menarche was 13. Age of menopause was 53. Patient admits to hormonal therapy. Ten years. Patient is G1P1. Age of first live birth was 20. Patient did not breast feed. Patient denies nipple discharge. Patient denies previous breast biopsy. Patient denies a personal history of breast cancer.   Past Medical History:  has a past medical history of Fibrocystic breast disease and HTN (hypertension) (08/18/2013).  Past Surgical History:  has a past surgical history that includes Hx of breast biopsy (1994); Laminectomy Lumbar Spine (L5-S1 2016); Colonoscopy (05/22/2012, 04/17/2002); egd (05/03/2016); and Breast excisional biopsy (Left, 10/2017).last cscope in 2016.  Family History: family history includes Coronary Artery Disease (Blocked arteries around heart) in her father; Stroke in her other.  Patenal aunt 80s, cousins 20s/30s/40s, NHL and lung CA sister, pancreatic CA father.  Social History:  reports that she quit smoking about 47 years ago. She has never used smokeless tobacco. She reports that she drinks alcohol. Her drug history is not on file.  Current Medications: has a current medication list which includes the following prescription(s): acetaminophen, azelastine, bimatoprost, cholecalciferol, cyanocobalamin, docusate, gabapentin, loratadine, meloxicam, mometasone, olmesartan-hydrochlorothiazide, pantoprazole, polyethylene glycol, timolol maleate, triamcinolone, and turmeric (bulk).  Allergies:       Allergies as of 11/06/2017 - Reviewed 11/06/2017  Allergen Reaction Noted  . Ace inhibitors  Angioedema 05/24/2017    ROS:  A 15 point review of systems was performed and was negative except as noted in HPI   Objective:   BP 149/76   Pulse 62   Temp 37.2 C (99 F) (Oral)   Ht 165.1 cm (5' 5")   Wt 71 kg (156 lb 8.4 oz)   BMI 26.05 kg/m   Constitutional :  alert, appears stated age, cooperative and no distress  Lymphatics/Throat:  no asymmetry, masses, or scars  Respiratory:  clear to auscultation bilaterally  Cardiovascular:  regular rate and rhythm  Gastrointestinal: soft, non-tender; bowel sounds normal; no masses,  no organomegaly.   Musculoskeletal: Steady gait and movement  Skin: Cool and moist  Psychiatric: Normal affect, non-agitated, not confused  Breast:  Chaperone present for exam.  breasts appear normal, no suspicious masses, no skin or nipple changes or axillary nodes.    LABS:  Surgical Pathology CASE: [id number] PATIENT: Megan Briggs Surgical Pathology Report     SPECIMEN SUBMITTED: A. Breast, left  CLINICAL HISTORY: Screening detected 6 mm mass with architectural distortion left UOQ  PRE-OPERATIVE DIAGNOSIS: IMC  POST-OPERATIVE DIAGNOSIS: Ribbon shaped tissue marker clip was deployed     DIAGNOSIS: A. BREAST, LEFT 2:30 5 CM FN; ULTRASOUND-GUIDED BIOPSY: - INVASIVE MAMMARY CARCINOMA, NO SPECIAL TYPE.  Size of invasive carcinoma: 1 mm in this sample Histologic grade of invasive carcinoma: Grade 1  Glandular/tubular differentiation score: 1  Nuclear pleomorphism score: 2  Mitotic rate score: 1  Total score: 4 Ductal carcinoma in situ: Not identified Lymphovascular invasion: Not identified  ER/PR/HER2: Immunohistochemistry will be deferred to an excision specimen as the carcinoma gets smaller on deeper levels.  Comment: The definitive grade will be assigned on the excisional specimen. These findings were communicated to Megan Briggs in Dr.    Lawrence's office on 11/04/2017. Read back procedure was performed.  GROSS DESCRIPTION: A. Labeled: Left breast 230, 5 cm from nipple Received: In formalin Time/date in fixative: 8:45 AM on 11/01/2017 Cold ischemic time: Less than 1 minutes Total fixation time: 11.25 hours Core pieces: Multiple Size: Aggregate, 1.5 x 0.4 x 0.1 cm Description: Yellow-red cores and fragments Ink color: Blue Entirely submitted in one cassette.    Final Diagnosis performed by Quay Burow, MD. Electronically signed 11/04/2017 12:28:29PM The electronic signature indicates that the named Attending Pathologist has evaluated the specimen  Technical component performed at [facility], [address], [place], [place] [address] Lab: [phone] Dir: Rush Farmer, MD, MMMProfessional component performed at [facility], [facility], [place], [place], [place] [address] Lab: [phone] Dir: Dellia Nims. Reuel Derby, MD      RADS: CLINICAL DATA:Screening recall for a left breast distortion.  EXAM: DIGITAL DIAGNOSTIC LEFT MAMMOGRAM WITH CAD AND TOMO  ULTRASOUND LEFT BREAST  COMPARISON:Previous exam(s).  ACR Breast Density Category c: The breast tissue is heterogeneously dense, which may obscure small masses.  FINDINGS: In the upper-outer aspect of the left breast, posterior depth there is a small mass measuring approximately 6 mm associated with calcifications and distortion.  Mammographic images were processed with CAD.  On physical exam, no suspicious palpable masses are identified in the lateral aspect of the left breast.  Ultrasound of the left breast at 230, 5 cm from the nipple demonstrates an irregular hypoechoic shadowing mass which is taller than wide measuring 6 x 5 x 4 mm. No blood flow seen within the mass on color Doppler imaging. Multiple normal-appearing lymph nodes are identified in the left axilla.  IMPRESSION: 1.There is a suspicious 6 mm mass in the  left breast at 2:30.  2.No evidence of left axillary lymphadenopathy.  RECOMMENDATION: 1. Ultrasound-guided biopsy is recommended for the left breast mass at 2:30. The patient is going on vacation tomorrow for the next 2 weeks. Per her request, she would like to schedule the biopsy after October 3rd.  I have discussed the findings and recommendations with the patient. Results were also provided in writing at the conclusion of the visit. If applicable, a reminder letter will be sent to the patient regarding the next appointment.  BI-RADS CATEGORY5: Highly suggestive of malignancy.   Electronically Signed By: MichelleCollins M.D. On: 10/16/2017 16:00  Assessment:   Malignant neoplasm of upper-outer quadrant of left female breast, unspecified estrogen receptor status (CMS-HCC) [C50.412]  Plan:   1. Malignant neoplasm of upper-outer quadrant of left female breast, unspecified estrogen receptor status (CMS-HCC) [C50.412]  Discussed the risk of surgery including recurrence, chronic pain, post-op infxn, poor/delayed wound healing, poor cosmesis, seroma, hematoma formation, and possible re-operation to address said risks. The risks of general anesthetic, if used, includes MI, CVA, sudden death or even reaction to anesthetic medications also discussed.  Typical post-op recovery time and possbility of activity restrictions were also discussed.  Alternatives include continued observation.  Benefits include possible symptom relief, pathologic evaluation, and/or curative excision.   The patient verbalized understanding and all questions were answered to the patient's satisfaction.  2. Patient has elected to proceed with surgical treatment. Procedure will be scheduled.  Written consent was obtained..     Electronically signed by Benjamine Sprague, DO on 11/08/2017 12:01 PM

## 2017-11-12 ENCOUNTER — Encounter: Payer: Self-pay | Admitting: Oncology

## 2017-11-12 ENCOUNTER — Inpatient Hospital Stay: Payer: Medicare PPO | Attending: Oncology | Admitting: Oncology

## 2017-11-12 ENCOUNTER — Encounter: Payer: Self-pay | Admitting: *Deleted

## 2017-11-12 ENCOUNTER — Other Ambulatory Visit: Payer: Self-pay

## 2017-11-12 ENCOUNTER — Encounter
Admission: RE | Admit: 2017-11-12 | Discharge: 2017-11-12 | Disposition: A | Discharge status: Home / Self Care | Payer: Medicare PPO | Source: Ambulatory Visit | Attending: Surgery | Admitting: Surgery

## 2017-11-12 VITALS — BP 146/82 | HR 65 | Temp 97.6°F | Resp 18 | Ht 66.0 in | Wt 159.7 lb

## 2017-11-12 DIAGNOSIS — Z79899 Other long term (current) drug therapy: Secondary | ICD-10-CM

## 2017-11-12 DIAGNOSIS — Z803 Family history of malignant neoplasm of breast: Secondary | ICD-10-CM

## 2017-11-12 DIAGNOSIS — Z01818 Encounter for other preprocedural examination: Secondary | ICD-10-CM

## 2017-11-12 DIAGNOSIS — N6092 Unspecified benign mammary dysplasia of left breast: Secondary | ICD-10-CM | POA: Diagnosis present

## 2017-11-12 DIAGNOSIS — I10 Essential (primary) hypertension: Secondary | ICD-10-CM | POA: Diagnosis not present

## 2017-11-12 DIAGNOSIS — Z87891 Personal history of nicotine dependence: Secondary | ICD-10-CM | POA: Diagnosis not present

## 2017-11-12 DIAGNOSIS — Z808 Family history of malignant neoplasm of other organs or systems: Secondary | ICD-10-CM | POA: Diagnosis not present

## 2017-11-12 DIAGNOSIS — C50412 Malignant neoplasm of upper-outer quadrant of left female breast: Secondary | ICD-10-CM | POA: Insufficient documentation

## 2017-11-12 DIAGNOSIS — Z7189 Other specified counseling: Secondary | ICD-10-CM

## 2017-11-12 HISTORY — DX: Malignant (primary) neoplasm, unspecified: C80.1

## 2017-11-12 NOTE — Progress Notes (Signed)
Hematology/Oncology Consult note [facility] Telephone:([phone] [phone]  Patient Care Team: Rusty Aus, MD as PCP - General (Internal Medicine)   Name of the patient: Megan Briggs  [id number]  1950/12/30    Reason for referral-new diagnosis of breast cancer   Referring physician-Dr. Sabra Heck  Date of visit: 11/12/17   History of presenting illness- Patient is a 67 year old seen in the past for normocytic anemia.  She is now been referred to me for new diagnosis of breast cancer.  Patient had a screening mammogram on 10/16/2017 which showed suspicious mass in the upper outer quadrant of the left breast 6 x 5 x 4 mm in size which was also confirmed on ultrasound.  No evidence of left axillary adenopathy.  Patient underwent biopsy of the breast mass which showed grade 1, 1 mm invasive mammary carcinoma.  ER PR status was not obtained due to the small size of the breast biopsy.  Patient has already seen Dr. Lysle Pearl and is scheduled to undergo a lumpectomy on 11/14/2017.  Family history significant for non-Hodgkin's lymphoma and lung cancer in her sister.  Dad had pancreatic cancer at the age of 15.  Paternal first cousin had breast cancer in her 107s and maternal first cousin had breast cancer in her 107s.  Patient's paternal aunt had breast cancer in her 64s.  Patient has 1 daughter who had a melanoma 20 years ago.  She was on Prempro hormone replacement therapy for 20 years but is not on it right now.  She has had prior breast biopsies in 1994 as well as aspiration which was negative for malignancy.  She currently feels well and her appetite is normal.  Denies any unintentional weight loss or aches or pains anywhere.  ECOG PS- 0  Pain scale- 0   Review of systems- Review of Systems  Constitutional: Negative for chills, fever, malaise/fatigue and weight loss.  HENT: Negative for congestion, ear discharge and nosebleeds.   Eyes: Negative for blurred  vision.  Respiratory: Negative for cough, hemoptysis, sputum production, shortness of breath and wheezing.   Cardiovascular: Negative for chest pain, palpitations, orthopnea and claudication.  Gastrointestinal: Negative for abdominal pain, blood in stool, constipation, diarrhea, heartburn, melena, nausea and vomiting.  Genitourinary: Negative for dysuria, flank pain, frequency, hematuria and urgency.  Musculoskeletal: Negative for back pain, joint pain and myalgias.  Skin: Negative for rash.  Neurological: Negative for dizziness, tingling, focal weakness, seizures, weakness and headaches.  Endo/Heme/Allergies: Does not bruise/bleed easily.  Psychiatric/Behavioral: Negative for depression and suicidal ideas. The patient does not have insomnia.     Allergies  Allergen Reactions  . Ace Inhibitors Anaphylaxis and Swelling    Lisinopril   . Tape Other (See Comments)    Band-aids caused redness and irritation    Patient Active Problem List   Diagnosis Date Noted  . Malignant neoplasm of upper-outer quadrant of left female breast ([place]) 11/12/2017  . Fibrocystic breast disease 06/07/2016  . Benign essential hypertension 03/20/2016  . Chronic anemia 03/20/2016  . Esophagitis, reflux 03/20/2016  . Degenerative disc disease, lumbar 09/15/2014     Past Medical History:  Diagnosis Date  . Anemia   . Glaucoma   . Hypertension   . Neuropathy      Past Surgical History:  Procedure Laterality Date  . BACK SURGERY    . BREAST BIOPSY Right 1995   benign  . BREAST BIOPSY Left 11/01/2017   US guided biopsy - ribbon clip  Social History   Socioeconomic History  . Marital status: Married    Spouse name: Not on file  . Number of children: Not on file  . Years of education: Not on file  . Highest education level: Not on file  Occupational History  . Not on file  Social Needs  . Financial resource strain: Not on file  . Food insecurity:    Worry: Not on file    Inability: Not  on file  . Transportation needs:    Medical: Not on file    Non-medical: Not on file  Tobacco Use  . Smoking status: Former Smoker    Packs/day: 0.25    Last attempt to quit: 06/08/1970    Years since quitting: 47.4  . Smokeless tobacco: Never Used  Substance and Sexual Activity  . Alcohol use: Yes    Alcohol/week: 3.0 standard drinks    Types: 3 Glasses of wine per week  . Drug use: No  . Sexual activity: Not on file  Lifestyle  . Physical activity:    Days per week: Not on file    Minutes per session: Not on file  . Stress: Not on file  Relationships  . Social connections:    Talks on phone: Not on file    Gets together: Not on file    Attends religious service: Not on file    Active member of club or organization: Not on file    Attends meetings of clubs or organizations: Not on file    Relationship status: Not on file  . Intimate partner violence:    Fear of current or ex partner: Not on file    Emotionally abused: Not on file    Physically abused: Not on file    Forced sexual activity: Not on file  Other Topics Concern  . Not on file  Social History Narrative  . Not on file     Family History  Problem Relation Age of Onset  . Breast cancer Cousin   . Breast cancer Cousin   . Breast cancer Paternal Aunt      Current Outpatient Medications:  .  azelastine (ASTELIN) 0.1 % nasal spray, Place 1 spray into both nostrils 2 (two) times daily. Use in each nostril as directed, Disp: , Rfl:  .  bimatoprost (LUMIGAN) 0.01 % SOLN, Place 1 drop into both eyes at bedtime., Disp: , Rfl:  .  Cholecalciferol (VITAMIN D) 2000 units tablet, Take 2,000 Units by mouth daily., Disp: , Rfl:  .  gabapentin (NEURONTIN) 300 MG capsule, Take 600 mg by mouth at bedtime. , Disp: , Rfl:  .  meloxicam (MOBIC) 7.5 MG tablet, Take 7.5 mg by mouth daily. , Disp: , Rfl:  .  olmesartan-hydrochlorothiazide (BENICAR HCT) 20-12.5 MG tablet, Take 0.5 tablets by mouth daily., Disp: , Rfl:  .   pantoprazole (PROTONIX) 40 MG tablet, Take 40 mg by mouth daily. , Disp: , Rfl:  .  timolol (BETIMOL) 0.5 % ophthalmic solution, Place 1 drop into both eyes 2 (two) times daily., Disp: , Rfl:  .  Turmeric 500 MG CAPS, Take 1 capsule by mouth daily., Disp: , Rfl:  .  vitamin B-12 (CYANOCOBALAMIN) 1000 MCG tablet, Take 1,000 mcg by mouth daily. , Disp: , Rfl:  .  acetaminophen (TYLENOL) 500 MG tablet, Take 1,000 mg by mouth at bedtime. , Disp: , Rfl:  .  sennosides-docusate sodium (SENOKOT-S) 8.6-50 MG tablet, Take 2 tablets by mouth at bedtime., Disp: , Rfl:  Physical exam:  Vitals:   11/12/17 1010  BP: (!) 146/82  Pulse: 65  Resp: 18  Temp: 97.6 F (36.4 C)  TempSrc: Tympanic  SpO2: 97%  Weight: 159 lb 11.2 oz (72.4 kg)  Height: '5\' 6"'$  (1.676 m)   Physical Exam  Constitutional: She is oriented to person, place, and time. She appears well-developed and well-nourished.  HENT:  Head: Normocephalic and atraumatic.  Eyes: Pupils are equal, round, and reactive to light. EOM are normal.  Neck: Normal range of motion.  Cardiovascular: Normal rate, regular rhythm and normal heart sounds.  Pulmonary/Chest: Effort normal and breath sounds normal.  Abdominal: Soft. Bowel sounds are normal.  Neurological: She is alert and oriented to person, place, and time.  Skin: Skin is warm and dry.  Breast exam performed in sitting and lying down position.  No evidence of palpable bilateral axillary adenopathy.  No evidence of bilateral palpable breast masses    CMP Latest Ref Rng & Units 06/07/2016  Glucose 65 - 99 mg/dL 91  BUN 6 - 20 mg/dL 26(H)  Creatinine 0.44 - 1.00 mg/dL 1.12(H)  Sodium 135 - 145 mmol/L 135  Potassium 3.5 - 5.1 mmol/L 4.3  Chloride 101 - 111 mmol/L 101  CO2 22 - 32 mmol/L 29  Calcium 8.9 - 10.3 mg/dL 9.7  Total Protein 6.5 - 8.1 g/dL 7.7  Total Bilirubin 0.3 - 1.2 mg/dL 0.6  Alkaline Phos 38 - 126 U/L 67  AST 15 - 41 U/L 23  ALT 14 - 54 U/L 27   CBC Latest Ref Rng &  Units 06/07/2016  WBC 3.6 - 11.0 K/uL 3.7  Hemoglobin 12.0 - 16.0 g/dL 10.8(L)  Hematocrit 35.0 - 47.0 % 32.0(L)  Platelets 150 - 440 K/uL 245    No images are attached to the encounter.  US Breast Ltd Uni Left Inc Axilla  Result Date: 10/16/2017 CLINICAL DATA:  Screening recall for a left breast distortion. EXAM: DIGITAL DIAGNOSTIC LEFT MAMMOGRAM WITH CAD AND TOMO ULTRASOUND LEFT BREAST COMPARISON:  Previous exam(s). ACR Breast Density Category c: The breast tissue is heterogeneously dense, which may obscure small masses. FINDINGS: In the upper-outer aspect of the left breast, posterior depth there is a small mass measuring approximately 6 mm associated with calcifications and distortion. Mammographic images were processed with CAD. On physical exam, no suspicious palpable masses are identified in the lateral aspect of the left breast. Ultrasound of the left breast at 230, 5 cm from the nipple demonstrates an irregular hypoechoic shadowing mass which is taller than wide measuring 6 x 5 x 4 mm. No blood flow seen within the mass on color Doppler imaging. Multiple normal-appearing lymph nodes are identified in the left axilla. IMPRESSION: 1.  There is a suspicious 6 mm mass in the left breast at 2:30. 2.  No evidence of left axillary lymphadenopathy. RECOMMENDATION: 1. Ultrasound-guided biopsy is recommended for the left breast mass at 2:30. The patient is going on vacation tomorrow for the next 2 weeks. Per her request, she would like to schedule the biopsy after October 3rd. I have discussed the findings and recommendations with the patient. Results were also provided in writing at the conclusion of the visit. If applicable, a reminder letter will be sent to the patient regarding the next appointment. BI-RADS CATEGORY  5: Highly suggestive of malignancy. Electronically Signed   By: Ammie Ferrier M.D.   On: 10/16/2017 16:00   Mm Diag Breast Tomo Uni Left  Result Date: 10/16/2017 CLINICAL DATA:  Screening recall for a left breast distortion. EXAM: DIGITAL DIAGNOSTIC LEFT MAMMOGRAM WITH CAD AND TOMO ULTRASOUND LEFT BREAST COMPARISON:  Previous exam(s). ACR Breast Density Category c: The breast tissue is heterogeneously dense, which may obscure small masses. FINDINGS: In the upper-outer aspect of the left breast, posterior depth there is a small mass measuring approximately 6 mm associated with calcifications and distortion. Mammographic images were processed with CAD. On physical exam, no suspicious palpable masses are identified in the lateral aspect of the left breast. Ultrasound of the left breast at 230, 5 cm from the nipple demonstrates an irregular hypoechoic shadowing mass which is taller than wide measuring 6 x 5 x 4 mm. No blood flow seen within the mass on color Doppler imaging. Multiple normal-appearing lymph nodes are identified in the left axilla. IMPRESSION: 1.  There is a suspicious 6 mm mass in the left breast at 2:30. 2.  No evidence of left axillary lymphadenopathy. RECOMMENDATION: 1. Ultrasound-guided biopsy is recommended for the left breast mass at 2:30. The patient is going on vacation tomorrow for the next 2 weeks. Per her request, she would like to schedule the biopsy after October 3rd. I have discussed the findings and recommendations with the patient. Results were also provided in writing at the conclusion of the visit. If applicable, a reminder letter will be sent to the patient regarding the next appointment. BI-RADS CATEGORY  5: Highly suggestive of malignancy. Electronically Signed   By: Ammie Ferrier M.D.   On: 10/16/2017 16:00   Mm Clip Placement Left  Result Date: 11/01/2017 CLINICAL DATA:  Confirmation of clip placement after ultrasound-guided core needle biopsy of a 6 mm mass associated with architectural distortion in the UPPER OUTER QUADRANT of the LEFT breast at POSTERIOR depth. EXAM: DIAGNOSTIC LEFT MAMMOGRAM POST ULTRASOUND BIOPSY COMPARISON:  Previous exam(s).  FINDINGS: Mammographic images were obtained following ultrasound guided biopsy of a mass in the [place] of the LEFT breast at POSTERIOR depth. The ribbon shaped tissue marker clip migrated approximately 7 mm anteromedial to the biopsied mass in the [place]. Expected post biopsy changes are present without evidence of hematoma. IMPRESSION: Approximate 7 mm anteromedial migration of the ribbon shaped tissue marker clip relative to the biopsied mass in the UPPER OUTER QUADRANT of the LEFT breast at POSTERIOR depth. Final Assessment: Post Procedure Mammograms for Marker Placement Electronically Signed   By: Evangeline Dakin M.D.   On: 11/01/2017 09:17   [place] Lt Breast Bx W Loc Dev 1st Lesion Img Bx Spec US Guide  Addendum Date: 11/04/2017   ADDENDUM REPORT: 11/04/2017 13:50 ADDENDUM: PATHOLOGY ADDENDUM: Pathology: Invasive mammary carcinoma Pathology concordant with imaging findings: Yes Recommendation: Surgical excision Localization/excision considerations: Wire localization Per Dr. Ammie Ferrier preference, the pathology results will be communicated by his nurse Glenda. Electronically Signed   By: Fidela Salisbury M.D.   On: 11/04/2017 13:50   Result Date: 11/04/2017 CLINICAL DATA:  Screening detected 6 mm mass associated with architectural distortion involving the UPPER OUTER QUADRANT of the LEFT breast at POSTERIOR depth, 2:30 o'clock approximately 5 cm from the nipple. EXAM: ULTRASOUND GUIDED LEFT BREAST CORE NEEDLE BIOPSY COMPARISON:  Previous exam(s). FINDINGS: I met with the patient and we discussed the procedure of ultrasound-guided biopsy, including benefits and alternatives. We discussed the high likelihood of a successful procedure. We discussed the risks of the procedure, including infection, bleeding, tissue injury, clip migration, and inadequate sampling. Informed written consent was given. The usual time-out protocol was performed immediately prior  to the procedure. Lesion  quadrant: UPPER OUTER QUADRANT. Using sterile technique with chlorhexidine as skin antisepsis, 1% Lidocaine as local anesthetic, under direct ultrasound visualization, a 12 gauge Bard Marquee core needle device was used to perform biopsy of the mass in the UPPER OUTER QUADRANT of the LEFT breast using a LATERAL approach. At the conclusion of the procedure a ribbon shaped tissue marker clip was deployed into the biopsy cavity. Follow up 2 view mammogram was performed and dictated separately. IMPRESSION: Ultrasound guided biopsy of a suspicious 6 mm mass involving the UPPER OUTER QUADRANT of the LEFT breast. No apparent complications. Electronically Signed: By: Evangeline Dakin M.D. On: 11/01/2017 09:16    Assessment and plan- Patient is a 66 y.o. female with newly diagnosed invasive mammary carcinoma of the left breast clinical prognostic stage IA cT1b cN0 MX on core biopsy  I discussed the results of the mammogram ultrasound and pathology with the patient in detail.  Overall patient found to have a small grade 1 invasive mammary carcinoma which appears to be about 6 mm on mammogram.  ER PR status was not done on core biopsy as it was a small 1 mm specimen.  She is due to undergo lumpectomy and sentinel lymph node biopsy on 11/14/2017.  I will see her back in 10 days time to discuss the results of final pathology and further management.  Of overall the size of the tumor is less than 10 mm and grade 1 on final pathology she would not require Oncotype testing and therefore not require adjuvant chemotherapy.  I will await the results of ER status on her final pathology to see if there would be a role for hormone therapy.  Given that she had a grade 1 tumor it is likely that she would be ER PR positive and HER-2/neu negative.  Treatment will be given with a curative intent  She would also see radiation oncology on the same day she sees me next week.  I will also refer her for genetic testing given extensive  family history as above   Total face to face encounter time for this patient visit was 45 min. >50% of the time was  spent in counseling and coordination of care.    Thank you for this kind referral and the opportunity to participate in the care of this patient   Visit Diagnosis 1. Malignant neoplasm of upper-outer quadrant of left female breast, unspecified estrogen receptor status ([place])   2. Goals of care, counseling/discussion     Dr. Randa Evens, MD, MPH [facility] [phone] 11/12/2017  2:02 PM

## 2017-11-12 NOTE — Patient Instructions (Signed)
Your procedure is scheduled on: Thursday 11/14/17.  Report to DAY SURGERY DEPARTMENT LOCATED ON 2ND FLOOR MEDICAL MALL ENTRANCE.   Remember: Instructions that are not followed completely may result in serious medical risk, up to and including death, or upon the discretion of your surgeon and anesthesiologist your surgery may need to be rescheduled.     _X__ 1. Do not eat food after midnight the night before your procedure.                 No gum chewing or hard candies. You may drink clear liquids up to 2 hours                 before you are scheduled to arrive for your surgery- DO NOT drink clear                 liquids within 2 hours of the start of your surgery.                 Clear Liquids include:  water, apple juice without pulp, clear carbohydrate                 drink such as Clearfast or Gatorade, Black Coffee or Tea (Do not add                 anything to coffee or tea).  __X__2.  On the morning of surgery brush your teeth with toothpaste and water, you may rinse your mouth with mouthwash if you wish.  Do not swallow any toothpaste of mouthwash.     _X__ 3.  No Alcohol for 24 hours before or after surgery.   _X__ 4.  Do Not Smoke or use e-cigarettes For 24 Hours Prior to Your Surgery.                 Do not use any chewable tobacco products for at least 6 hours prior to                 surgery.  __X__5.  Notify your doctor if there is any change in your medical condition      (cold, fever, infections).      Do not wear jewelry, make-up, hairpins, clips or nail polish. Do not wear lotions, powders, or perfumes.   Do not shave 48 hours prior to surgery. Men may shave face and neck. Do not bring valuables to the hospital.    [facility] is not responsible for any belongings or valuables.  Contacts, dentures/partials or body piercings may not be worn into surgery. Bring a case for your contacts, glasses or hearing aids, a denture cup will be supplied. Leave your suitcase in  the car. After surgery it may be brought to your room. For patients admitted to the hospital, discharge time is determined by your treatment team.   Patients discharged the day of surgery will not be allowed to drive home.   Please read over the following fact sheets that you were given:   MRSA Information  __X__ Take these medicines the morning of surgery with A SIP OF WATER:     1. acetaminophen (TYLENOL) 500 MG tablet  2. azelastine (ASTELIN) 0.1 % nasal spray  3. pantoprazole (PROTONIX) 40 MG tablet   4. timolol (BETIMOL) 0.5 % ophthalmic solution  5.   6.     __X__ Use CHG Soap as directed   __X__ Stop Anti-inflammatories 7 days before surgery such as Advil, Ibuprofen, Motrin, BC or  Goodies Powder, Naprosyn, Naproxen, Aleve, Aspirin, Meloxicam. May take Tylenol if needed for pain or discomfort.    __X__ Stop all herbal supplements. Turmeric 500 MG CAPS

## 2017-11-12 NOTE — Progress Notes (Signed)
  Oncology Nurse Navigator Documentation  Navigator Location: [facility] (11/12/17 1100) Referral date to RadOnc/MedOnc: 11/12/17 (11/12/17 1100) )Navigator Encounter Type: Initial MedOnc (11/12/17 1100)       Surgery Date: 11/14/17 (11/12/17 1100) Genetic Counseling Date: 11/12/17 (11/12/17 1100) Genetic Counseling Type: Non-Urgent (11/12/17 1100)         Patient Visit Type: MedOnc (11/12/17 1100) Treatment Phase: Pre-Tx/Tx Discussion (11/12/17 1100) Barriers/Navigation Needs: Education (11/12/17 1100) Education: Understanding Cancer/ Treatment Options;Coping with Diagnosis/ Prognosis (11/12/17 1100) Interventions: Education (11/12/17 1100)                      Time Spent with Patient: 45 (11/12/17 1100)   Met patient and her husband during her initial medical oncology consult with Dr. Janese Banks.  Patient is newly diagnosed with a very small invasive left breast cancer.  She is planning for lumpectomy on Thursday.  ER/PR/Her2 will be completed on the final pathology.  Patient is scheduled to return to see Dr. Janese Banks next Thursday for final treatment planning.  Patient was given Gave patient breast cancer educational literature, "My Breast Cancer Treatment Handbook" by Josephine Igo, RN at Dr. Ines Bloomer office.  She is to call if she has any questions or needs.

## 2017-11-12 NOTE — Pre-Procedure Instructions (Signed)
Surgical consent was not signed in PAT. The patient was not comfortable with the term "partial mastectomy". She was familiar with the term "lumpectomy" that had been used in conversation with Dr. Lysle Pearl. I called and spoke with Vicky at Dr. Ines Bloomer office to let them know.

## 2017-11-13 MED ORDER — CEFAZOLIN SODIUM-DEXTROSE 2-4 GM/100ML-% IV SOLN
2.0000 g | INTRAVENOUS | Status: AC
  Administered 2017-11-14: 2 g via INTRAVENOUS

## 2017-11-14 ENCOUNTER — Ambulatory Visit
Admission: RE | Admit: 2017-11-14 | Discharge: 2017-11-14 | Disposition: A | Discharge status: Home / Self Care | Payer: Medicare PPO | Source: Ambulatory Visit | Attending: Surgery | Admitting: Surgery

## 2017-11-14 ENCOUNTER — Ambulatory Visit: Payer: Medicare PPO | Admitting: Certified Registered Nurse Anesthetist

## 2017-11-14 ENCOUNTER — Encounter
Admission: RE | Disposition: A | Discharge status: Home / Self Care | Payer: Self-pay | Source: Ambulatory Visit | Attending: Surgery

## 2017-11-14 ENCOUNTER — Other Ambulatory Visit: Payer: Self-pay

## 2017-11-14 DIAGNOSIS — N6092 Unspecified benign mammary dysplasia of left breast: Secondary | ICD-10-CM | POA: Insufficient documentation

## 2017-11-14 DIAGNOSIS — C50412 Malignant neoplasm of upper-outer quadrant of left female breast: Secondary | ICD-10-CM

## 2017-11-14 DIAGNOSIS — Z87891 Personal history of nicotine dependence: Secondary | ICD-10-CM | POA: Insufficient documentation

## 2017-11-14 DIAGNOSIS — D649 Anemia, unspecified: Secondary | ICD-10-CM

## 2017-11-14 DIAGNOSIS — I10 Essential (primary) hypertension: Secondary | ICD-10-CM | POA: Insufficient documentation

## 2017-11-14 DIAGNOSIS — Z79899 Other long term (current) drug therapy: Secondary | ICD-10-CM | POA: Insufficient documentation

## 2017-11-14 HISTORY — PX: SENTINEL NODE BIOPSY: SHX6608

## 2017-11-14 HISTORY — PX: BREAST LUMPECTOMY: SHX2

## 2017-11-14 HISTORY — PX: BREAST LUMPECTOMY WITH NEEDLE LOCALIZATION: SHX5759

## 2017-11-14 SURGERY — BREAST LUMPECTOMY WITH NEEDLE LOCALIZATION
Anesthesia: General | Laterality: Left

## 2017-11-14 MED ORDER — METHYLENE BLUE 0.5 % INJ SOLN
INTRAVENOUS | Status: AC
  Filled 2017-11-14: qty 10

## 2017-11-14 MED ORDER — LIDOCAINE HCL 1 % IJ SOLN
INTRAMUSCULAR | Status: DC | PRN
  Administered 2017-11-14: 4 mL via INTRAMUSCULAR

## 2017-11-14 MED ORDER — CEFAZOLIN SODIUM-DEXTROSE 2-4 GM/100ML-% IV SOLN
INTRAVENOUS | Status: AC
  Filled 2017-11-14: qty 100

## 2017-11-14 MED ORDER — LACTATED RINGERS IV SOLN
INTRAVENOUS | Status: DC
  Administered 2017-11-14: 10:00:00 via INTRAVENOUS

## 2017-11-14 MED ORDER — CHLORHEXIDINE GLUCONATE CLOTH 2 % EX PADS
6.0000 | MEDICATED_PAD | Freq: Once | CUTANEOUS | Status: AC
  Administered 2017-11-14: 6 via TOPICAL

## 2017-11-14 MED ORDER — LIDOCAINE HCL (CARDIAC) PF 100 MG/5ML IV SOSY
PREFILLED_SYRINGE | INTRAVENOUS | Status: DC | PRN
  Administered 2017-11-14: 60 mg via INTRAVENOUS

## 2017-11-14 MED ORDER — FENTANYL CITRATE (PF) 100 MCG/2ML IJ SOLN: 25.0000 ug | INTRAMUSCULAR | Status: DC | PRN

## 2017-11-14 MED ORDER — SODIUM CHLORIDE 0.9 % IV SOLN
INTRAVENOUS | Status: DC | PRN
  Administered 2017-11-14: 20 ug/min via INTRAVENOUS

## 2017-11-14 MED ORDER — EPHEDRINE SULFATE 50 MG/ML IJ SOLN
INTRAMUSCULAR | Status: DC | PRN
  Administered 2017-11-14: 5 mg via INTRAVENOUS
  Administered 2017-11-14: 10 mg via INTRAVENOUS
  Administered 2017-11-14 (×2): 5 mg via INTRAVENOUS

## 2017-11-14 MED ORDER — HYDROCODONE-ACETAMINOPHEN 5-325 MG PO TABS
1.0000 | ORAL_TABLET | Freq: Four times a day (QID) | ORAL | Status: DC | PRN
  Administered 2017-11-14: 1 via ORAL

## 2017-11-14 MED ORDER — ACETAMINOPHEN 500 MG PO TABS
ORAL_TABLET | ORAL | Status: AC
  Administered 2017-11-14: 1000 mg via ORAL
  Filled 2017-11-14: qty 2

## 2017-11-14 MED ORDER — BUPIVACAINE HCL (PF) 0.5 % IJ SOLN
INTRAMUSCULAR | Status: AC
  Filled 2017-11-14: qty 30

## 2017-11-14 MED ORDER — LIDOCAINE HCL (PF) 1 % IJ SOLN
INTRAMUSCULAR | Status: AC
  Filled 2017-11-14: qty 30

## 2017-11-14 MED ORDER — ACETAMINOPHEN 10 MG/ML IV SOLN
INTRAVENOUS | Status: AC
  Filled 2017-11-14: qty 100

## 2017-11-14 MED ORDER — ACETAMINOPHEN 500 MG PO TABS
1000.0000 mg | ORAL_TABLET | ORAL | Status: AC
  Administered 2017-11-14: 1000 mg via ORAL

## 2017-11-14 MED ORDER — GLYCOPYRROLATE 0.2 MG/ML IJ SOLN
INTRAMUSCULAR | Status: DC | PRN
  Administered 2017-11-14: 0.2 mg via INTRAVENOUS

## 2017-11-14 MED ORDER — DOCUSATE SODIUM 100 MG PO CAPS: 100.0000 mg | ORAL_CAPSULE | Freq: Two times a day (BID) | ORAL | 0 refills | Status: AC | PRN

## 2017-11-14 MED ORDER — MIDAZOLAM HCL 2 MG/2ML IJ SOLN
INTRAMUSCULAR | Status: AC
  Filled 2017-11-14: qty 2

## 2017-11-14 MED ORDER — HYDROCODONE-ACETAMINOPHEN 5-325 MG PO TABS
ORAL_TABLET | ORAL | Status: AC
  Filled 2017-11-14: qty 1

## 2017-11-14 MED ORDER — IBUPROFEN 800 MG PO TABS: 800.0000 mg | ORAL_TABLET | Freq: Three times a day (TID) | ORAL | 0 refills | Status: DC | PRN

## 2017-11-14 MED ORDER — DEXAMETHASONE SODIUM PHOSPHATE 10 MG/ML IJ SOLN
INTRAMUSCULAR | Status: DC | PRN
  Administered 2017-11-14: 4 mg via INTRAVENOUS

## 2017-11-14 MED ORDER — PROPOFOL 10 MG/ML IV BOLUS
INTRAVENOUS | Status: DC | PRN
  Administered 2017-11-14: 120 mg via INTRAVENOUS

## 2017-11-14 MED ORDER — DEXAMETHASONE SODIUM PHOSPHATE 10 MG/ML IJ SOLN
INTRAMUSCULAR | Status: AC
  Filled 2017-11-14: qty 1

## 2017-11-14 MED ORDER — TECHNETIUM TC 99M SULFUR COLLOID FILTERED
1.0000 | Freq: Once | INTRAVENOUS | Status: AC | PRN
  Administered 2017-11-14: 0.719 via INTRADERMAL

## 2017-11-14 MED ORDER — ONDANSETRON HCL 4 MG/2ML IJ SOLN: 4.0000 mg | Freq: Once | INTRAMUSCULAR | Status: DC | PRN

## 2017-11-14 MED ORDER — GLYCOPYRROLATE 0.2 MG/ML IJ SOLN
INTRAMUSCULAR | Status: AC
  Filled 2017-11-14: qty 1

## 2017-11-14 MED ORDER — PHENYLEPHRINE HCL 10 MG/ML IJ SOLN
INTRAMUSCULAR | Status: DC | PRN
  Administered 2017-11-14: 50 ug via INTRAVENOUS

## 2017-11-14 MED ORDER — PROPOFOL 10 MG/ML IV BOLUS
INTRAVENOUS | Status: AC
  Filled 2017-11-14: qty 20

## 2017-11-14 MED ORDER — ONDANSETRON HCL 4 MG/2ML IJ SOLN
INTRAMUSCULAR | Status: DC | PRN
  Administered 2017-11-14: 4 mg via INTRAVENOUS

## 2017-11-14 MED ORDER — ACETAMINOPHEN 325 MG PO TABS: 650.0000 mg | ORAL_TABLET | Freq: Every day | ORAL | 0 refills | Status: AC

## 2017-11-14 MED ORDER — FENTANYL CITRATE (PF) 100 MCG/2ML IJ SOLN
INTRAMUSCULAR | Status: DC | PRN
  Administered 2017-11-14 (×2): 25 ug via INTRAVENOUS
  Administered 2017-11-14: 50 ug via INTRAVENOUS

## 2017-11-14 MED ORDER — HYDROCODONE-ACETAMINOPHEN 5-325 MG PO TABS: 1.0000 | ORAL_TABLET | Freq: Four times a day (QID) | ORAL | 0 refills | Status: AC | PRN

## 2017-11-14 MED ORDER — LIDOCAINE HCL (PF) 2 % IJ SOLN
INTRAMUSCULAR | Status: AC
  Filled 2017-11-14: qty 10

## 2017-11-14 MED ORDER — MIDAZOLAM HCL 2 MG/2ML IJ SOLN
INTRAMUSCULAR | Status: DC | PRN
  Administered 2017-11-14: 2 mg via INTRAVENOUS

## 2017-11-14 MED ORDER — FENTANYL CITRATE (PF) 100 MCG/2ML IJ SOLN
INTRAMUSCULAR | Status: AC
  Filled 2017-11-14: qty 2

## 2017-11-14 SURGICAL SUPPLY — 47 items
APPLIER CLIP 11 MED OPEN (CLIP)
BLADE SURG 15 STRL LF DISP TIS (BLADE) ×1 IMPLANT
BLADE SURG 15 STRL SS (BLADE) ×1
CANISTER SUCT 1200ML W/VALVE (MISCELLANEOUS) ×2 IMPLANT
CHLORAPREP W/TINT 26ML (MISCELLANEOUS) ×2 IMPLANT
CLIP APPLIE 11 MED OPEN (CLIP) IMPLANT
CNTNR SPEC 2.5X3XGRAD LEK (MISCELLANEOUS) ×2
CONT SPEC 4OZ STER OR WHT (MISCELLANEOUS) ×2
CONTAINER SPEC 2.5X3XGRAD LEK (MISCELLANEOUS) ×2 IMPLANT
COVER WAND RF STERILE (DRAPES) IMPLANT
DERMABOND ADVANCED (GAUZE/BANDAGES/DRESSINGS) ×1
DERMABOND ADVANCED .7 DNX12 (GAUZE/BANDAGES/DRESSINGS) ×1 IMPLANT
DEVICE DUBIN SPECIMEN MAMMOGRA (MISCELLANEOUS) ×2 IMPLANT
DRAPE CHEST BREAST 77X106 FENE (MISCELLANEOUS) IMPLANT
DRAPE LAPAROTOMY 77X122 PED (DRAPES) ×2 IMPLANT
DRAPE SHEET LG 3/4 BI-LAMINATE (DRAPES) ×2 IMPLANT
ELECT CAUTERY BLADE TIP 2.5 (TIP) ×2
ELECT CAUTERY NEEDLE 2.0 MIC (NEEDLE) ×2 IMPLANT
ELECT REM PT RETURN 9FT ADLT (ELECTROSURGICAL) ×2
ELECTRODE CAUTERY BLDE TIP 2.5 (TIP) ×1 IMPLANT
ELECTRODE REM PT RTRN 9FT ADLT (ELECTROSURGICAL) ×1 IMPLANT
GAUZE SPONGE 4X4 12PLY STRL (GAUZE/BANDAGES/DRESSINGS) IMPLANT
GLOVE BIOGEL PI IND STRL 7.0 (GLOVE) ×1 IMPLANT
GLOVE BIOGEL PI INDICATOR 7.0 (GLOVE) ×1
GLOVE SURG SYN 7.0 (GLOVE) ×4 IMPLANT
GOWN STRL REUS W/ TWL LRG LVL3 (GOWN DISPOSABLE) ×3 IMPLANT
GOWN STRL REUS W/TWL LRG LVL3 (GOWN DISPOSABLE) ×3
JACKSON PRATT 10 (INSTRUMENTS) IMPLANT
KIT TURNOVER KIT A (KITS) ×2 IMPLANT
LABEL OR SOLS (LABEL) ×2 IMPLANT
LIGHT WAVEGUIDE WIDE FLAT (MISCELLANEOUS) ×2 IMPLANT
NEEDLE HYPO 25X1 1.5 SAFETY (NEEDLE) ×4 IMPLANT
PACK BASIN MINOR ARMC (MISCELLANEOUS) ×2 IMPLANT
SLEVE PROBE SENORX GAMMA FIND (MISCELLANEOUS) ×2 IMPLANT
SUT MNCRL 4-0 (SUTURE) ×1
SUT MNCRL 4-0 27XMFL (SUTURE) ×1
SUT SILK 2 0 (SUTURE) ×1
SUT SILK 2 0 SH (SUTURE) ×2 IMPLANT
SUT SILK 2-0 30XBRD TIE 12 (SUTURE) ×1 IMPLANT
SUT SILK 3 0 12 30 (SUTURE) ×2 IMPLANT
SUT SILK 3 0 SH 30 (SUTURE) ×2 IMPLANT
SUT VIC AB 3-0 SH 27 (SUTURE) ×1
SUT VIC AB 3-0 SH 27X BRD (SUTURE) ×1 IMPLANT
SUTURE MNCRL 4-0 27XMF (SUTURE) ×1 IMPLANT
SYR 10ML LL (SYRINGE) ×2 IMPLANT
SYR 50ML LL SCALE MARK (SYRINGE) IMPLANT
WATER STERILE IRR 1000ML POUR (IV SOLUTION) ×2 IMPLANT

## 2017-11-14 NOTE — Op Note (Signed)
Preoperative diagnosis:  left breast carcinoma.  Postoperative diagnosis: same.   Procedure: needle-localized breast lumpectomy.                      left Axillary Sentinel Lymph node biopsy  Anesthesia: GETA  Surgeon: Dr. Benjamine Sprague  Wound Classification: Clean  Indications: Patient is a 67 y.o. female with a nonpalpable left  breast mass noted on mammography with core biopsy demonstrating ductal carcinoma requires needle-localized lumpectomy for treatment with sentinel lymph node biopsy.   Specimen: left  Breast mass, Sentinel Lymph nodes x 1  Complications: None  Estimated Blood Loss: 43mL  Findings: 1. Specimen mammography shows marker and wire on specimen 2. Pathology call refers gross examination of margins was negative.  Some dermis noted in anterior portion although specimen not close to skin.  Therefore the anterior margin was resected further analysis 3. No other palpable mass or lymph node identified.   Description of procedure: Preoperative needle localization was performed by radiology. In the nuclear medicine suite, the subareolar region was injected with Tc-99 sulfur colloid. Localization studies were reviewed. The patient was taken to the operating room and placed supine on the operating table, and after general anesthesia the left breast and axilla were prepped and draped in the usual sterile fashion. A time-out was completed verifying correct patient, procedure, site, positioning, and implant(s) and/or special equipment prior to beginning this procedure.  By comparing the localization studies with the direction and skin entry site of the needle, the probable trajectory and location of the mass was visualized. Skin incision was planned in such a way as to minimize the amount of dissection to reach the mass.  The skin incision was made after infusion of local. Flaps were raised and the location of the wire confirmed. The wire was delivered into the wound. Sharp and blunt  dissection was then taken down to the mass, located within the breast tissue , measuring approximately 0.5cm x 0.5cm x 0.5cm, taking care to include the entire localizing needle and a margin of grossly normal tissue. The specimen and entire localizing wire were removed. The specimen was oriented with long lateral, short superior, deep double sutures and sent to radiology with the localization studies. Confirmation was received that the entire target lesion had been resected.  Pathology called  and gross examination of margins was negative.  Some dermis noted in anterior portion although specimen not close to skin.  Therefore the anterior margin was resected for further analysis.  A hand-held gamma probe was used to identify the location of the hottest spot in the axilla, from the same incision.  Sharp and blunt Dissection was carried down to subdermal facias. The probe was placed within wound and again, the point of maximal count was found. Dissection continue until nodule was identified. The probe was placed in contact with the node and 12000 counts were recorded. The node was excised in its entirety. Ex vivo, the node measured around 12000 counts when placed on the probe. The bed of the node measured <140 counts.  No additional clinically abnormal nodes were palpated.  Wounds irrigated, hemostasis was achieved with several silk sutures throughout procedure and no active bleeding noted at end. Wound closed in layers with  interrupted sutures of 3-0 Vicryl in deep dermal layer and a running subcuticular suture of Monocryl 4-0, then dressed with dermabond. The patient tolerated the procedure well and was taken to the postanesthesia care unit in stable condition. Sponge and instrument count  correct at end of procedure.

## 2017-11-14 NOTE — Interval H&P Note (Signed)
History and Physical Interval Note:  11/14/2017 9:47 AM  Megan Briggs  has presented today for surgery, with the diagnosis of left breast cancer  The various methods of treatment have been discussed with the patient and family. After consideration of risks, benefits and other options for treatment, the patient has consented to  Procedure(s): BREAST LUMPECTOMY WITH NEEDLE LOCALIZATION (Left) SENTINEL NODE BIOPSY (Left) as a surgical intervention .  The patient's history has been reviewed, patient examined, no change in status, stable for surgery.  I have reviewed the patient's chart and labs.  Questions were answered to the patient's satisfaction.     Keyanna Sandefer Lysle Pearl

## 2017-11-14 NOTE — Anesthesia Preprocedure Evaluation (Signed)
Anesthesia Evaluation  Patient identified by MRN, date of birth, ID band Patient awake    Reviewed: Allergy & Precautions, NPO status , Patient's Chart, lab work & pertinent test results  History of Anesthesia Complications Negative for: history of anesthetic complications  Airway Mallampati: II       Dental   Pulmonary neg sleep apnea, neg COPD, former smoker,           Cardiovascular hypertension, Pt. on medications (-) Past MI and (-) CHF (-) dysrhythmias (-) Valvular Problems/Murmurs     Neuro/Psych neg Seizures    GI/Hepatic Neg liver ROS, neg GERD  ,  Endo/Other  neg diabetes  Renal/GU negative Renal ROS     Musculoskeletal   Abdominal   Peds  Hematology  (+) anemia ,   Anesthesia Other Findings   Reproductive/Obstetrics                             Anesthesia Physical Anesthesia Plan  ASA: II  Anesthesia Plan: General   Post-op Pain Management:    Induction:   PONV Risk Score and Plan: 3 and Dexamethasone, Ondansetron and Midazolam  Airway Management Planned: LMA  Additional Equipment:   Intra-op Plan:   Post-operative Plan:   Informed Consent: I have reviewed the patients History and Physical, chart, labs and discussed the procedure including the risks, benefits and alternatives for the proposed anesthesia with the patient or authorized representative who has indicated his/her understanding and acceptance.     Plan Discussed with:   Anesthesia Plan Comments:         Anesthesia Quick Evaluation

## 2017-11-14 NOTE — Transfer of Care (Signed)
Immediate Anesthesia Transfer of Care Note  Patient: Megan Briggs  Procedure(s) Performed: BREAST LUMPECTOMY WITH NEEDLE LOCALIZATION (Left ) SENTINEL NODE BIOPSY (Left )  Patient Location: PACU  Anesthesia Type:General  Level of Consciousness: drowsy  Airway & Oxygen Therapy: Patient Spontanous Breathing and Patient connected to face mask  Post-op Assessment: Report given to RN and Post -op Vital signs reviewed and stable  Post vital signs: Reviewed and stable  Last Vitals:  Vitals Value Taken Time  BP 120/67 11/14/2017  1:17 PM  Temp    Pulse 65 11/14/2017  1:18 PM  Resp 9 11/14/2017  1:18 PM  SpO2 100 % 11/14/2017  1:18 PM  Vitals shown include unvalidated device data.  Last Pain:  Vitals:   11/14/17 0936  TempSrc: Temporal  PainSc: 0-No pain         Complications: No apparent anesthesia complications

## 2017-11-14 NOTE — Anesthesia Procedure Notes (Signed)
Procedure Name: LMA Insertion Date/Time: 11/14/2017 11:25 AM Performed by: Eben Burow, CRNA Pre-anesthesia Checklist: Patient identified, Emergency Drugs available, Suction available, Patient being monitored and Timeout performed Patient Re-evaluated:Patient Re-evaluated prior to induction Oxygen Delivery Method: Circle system utilized Preoxygenation: Pre-oxygenation with 100% oxygen Induction Type: IV induction Ventilation: Mask ventilation without difficulty LMA: LMA inserted LMA Size: 4.0 Number of attempts: 1 Placement Confirmation: positive ETCO2,  CO2 detector and breath sounds checked- equal and bilateral Tube secured with: Tape Dental Injury: Teeth and Oropharynx as per pre-operative assessment

## 2017-11-14 NOTE — Anesthesia Post-op Follow-up Note (Signed)
Anesthesia QCDR form completed.        

## 2017-11-14 NOTE — Discharge Instructions (Signed)

## 2017-11-15 ENCOUNTER — Encounter: Payer: Self-pay | Admitting: Surgery

## 2017-11-17 NOTE — Anesthesia Postprocedure Evaluation (Signed)
Anesthesia Post Note  Patient: Megan Briggs  Procedure(s) Performed: BREAST LUMPECTOMY WITH NEEDLE LOCALIZATION (Left ) SENTINEL NODE BIOPSY (Left )  Patient location during evaluation: PACU Anesthesia Type: General Level of consciousness: awake and alert Pain management: pain level controlled Vital Signs Assessment: post-procedure vital signs reviewed and stable Respiratory status: spontaneous breathing, nonlabored ventilation and respiratory function stable Cardiovascular status: blood pressure returned to baseline and stable Postop Assessment: no apparent nausea or vomiting Anesthetic complications: no     Last Vitals:  Vitals:   11/14/17 1409 11/14/17 1451  BP: 133/73 128/64  Pulse: 67 (!) 58  Resp: 16 16  Temp: (!) 36.2 C   SpO2: 98% 100%    Last Pain:  Vitals:   11/15/17 0834  TempSrc:   PainSc: [place]

## 2017-11-21 ENCOUNTER — Ambulatory Visit
Admission: RE | Admit: 2017-11-21 | Discharge: 2017-11-21 | Disposition: A | Discharge status: Home / Self Care | Payer: Medicare PPO | Source: Ambulatory Visit | Attending: Radiation Oncology | Admitting: Radiation Oncology

## 2017-11-21 ENCOUNTER — Other Ambulatory Visit: Payer: Self-pay

## 2017-11-21 ENCOUNTER — Encounter: Payer: Self-pay | Admitting: Oncology

## 2017-11-21 ENCOUNTER — Inpatient Hospital Stay: Payer: Medicare PPO | Admitting: Oncology

## 2017-11-21 VITALS — BP 152/81 | HR 55 | Temp 96.2°F | Resp 18 | Ht 66.0 in | Wt 159.2 lb

## 2017-11-21 DIAGNOSIS — I10 Essential (primary) hypertension: Secondary | ICD-10-CM

## 2017-11-21 DIAGNOSIS — Z7189 Other specified counseling: Secondary | ICD-10-CM

## 2017-11-21 DIAGNOSIS — Z79899 Other long term (current) drug therapy: Secondary | ICD-10-CM | POA: Diagnosis not present

## 2017-11-21 DIAGNOSIS — Z803 Family history of malignant neoplasm of breast: Secondary | ICD-10-CM

## 2017-11-21 DIAGNOSIS — Z87891 Personal history of nicotine dependence: Secondary | ICD-10-CM

## 2017-11-21 DIAGNOSIS — Z808 Family history of malignant neoplasm of other organs or systems: Secondary | ICD-10-CM

## 2017-11-21 DIAGNOSIS — C50412 Malignant neoplasm of upper-outer quadrant of left female breast: Secondary | ICD-10-CM

## 2017-11-21 NOTE — Consult Note (Signed)
NEW PATIENT EVALUATION  Name: Megan Briggs  MRN: [id number]  Date:   11/21/2017     DOB: February 25, 1950   This 67 y.o. female patient presents to the clinic for initial evaluation of tage I (T1 BN 0 M0) invasive mammary carcinoma left breast as was wide local excision and sentinel node biopsy ER/PR status pending.  REFERRING PHYSICIAN: Rusty Aus, MD  CHIEF COMPLAINT: No chief complaint on file.   DIAGNOSIS: The encounter diagnosis was Malignant neoplasm of upper-outer quadrant of left female breast, unspecified estrogen receptor status ([place]).   PREVIOUS INVESTIGATIONS:  Mammogram and ultrasound reviewed Pathology reports reviewed Clinical notes reviewed  HPI: patient is a 67 year old female who presented with an abnormal mammogram of her left breast showing a 6 mm suspicious mass in the left breast at the 2:30 position no evidence of left axillary lymphadenopathy. This was confirmed on ultrasound and she underwent ultrasound guided biopsy positive for invasive mammary carcinoma grade 1. She went on to have a wide local excision and sentinel node biopsy.final path showed a tumor 7 mmwith overall grade 1 Margins were clear at 2 mm DCIS margin were also uninvolved at 5 mm.one sentinel lymph node was negative for malignancy.ER/PR status is still pending. She is doing well postoperatively. She specifically denies breast tenderness cough or bone pain. She's been seen by medical oncology and plan at this time is dependent on her ER/PR status.  PLANNED TREATMENT REGIMEN: left whole breast radiation hypofractionated  PAST MEDICAL HISTORY:  has a past medical history of Anemia, Cancer ([place]), Glaucoma, Hypertension, and Neuropathy.    PAST SURGICAL HISTORY:  Past Surgical History:  Procedure Laterality Date  . BACK SURGERY    . BREAST BIOPSY Right 1995   benign  . BREAST BIOPSY Left 11/01/2017   US guided biopsy - ribbon clip  . BREAST LUMPECTOMY Left 11/14/2017  . BREAST LUMPECTOMY WITH  NEEDLE LOCALIZATION Left 11/14/2017   Procedure: BREAST LUMPECTOMY WITH NEEDLE LOCALIZATION;  Surgeon: Benjamine Sprague, DO;  Location: [facility] ORS;  Service: General;  Laterality: Left;  . COLONOSCOPY WITH PROPOFOL    . SENTINEL NODE BIOPSY Left 11/14/2017   Procedure: SENTINEL NODE BIOPSY;  Surgeon: Benjamine Sprague, DO;  Location: [facility] ORS;  Service: General;  Laterality: Left;    FAMILY HISTORY: family history includes Breast cancer in her cousin, cousin, and paternal aunt.  SOCIAL HISTORY:  reports that she quit smoking about 47 years ago. She smoked 0.25 packs per day. She has never used smokeless tobacco. She reports that she drinks about 3.0 standard drinks of alcohol per week. She reports that she does not use drugs.  ALLERGIES: Ace inhibitors and Tape  MEDICATIONS:  Current Outpatient Medications  Medication Sig Dispense Refill  . acetaminophen (TYLENOL) 325 MG tablet Take 2 tablets (650 mg total) by mouth at bedtime for 10 days. (Patient not taking: Reported on 11/21/2017) 20 tablet 0  . azelastine (ASTELIN) 0.1 % nasal spray Place 1 spray into both nostrils 2 (two) times daily. Use in each nostril as directed    . bimatoprost (LUMIGAN) 0.01 % SOLN Place 1 drop into both eyes at bedtime.    . Cholecalciferol (VITAMIN D) 2000 units tablet Take 2,000 Units by mouth daily.    Marland Kitchen docusate sodium (COLACE) 100 MG capsule Take 1 capsule (100 mg total) by mouth 2 (two) times daily as needed for up to 10 days for mild constipation. (Patient not taking: Reported on 11/21/2017) 20 capsule 0  . gabapentin (NEURONTIN) 300  MG capsule Take 600 mg by mouth at bedtime.     Marland Kitchen ibuprofen (ADVIL,MOTRIN) 800 MG tablet Take 1 tablet (800 mg total) by mouth every 8 (eight) hours as needed for mild pain or moderate pain. (Patient not taking: Reported on 11/21/2017) 30 tablet 0  . meloxicam (MOBIC) 7.5 MG tablet Take 7.5 mg by mouth daily.     Marland Kitchen olmesartan-hydrochlorothiazide (BENICAR HCT) 20-12.5 MG tablet Take 0.5  tablets by mouth daily.    . pantoprazole (PROTONIX) 40 MG tablet Take 40 mg by mouth daily.     . sennosides-docusate sodium (SENOKOT-S) 8.6-50 MG tablet Take 2 tablets by mouth at bedtime.    . timolol (BETIMOL) 0.5 % ophthalmic solution Place 1 drop into both eyes 2 (two) times daily.    . Turmeric 500 MG CAPS Take 1 capsule by mouth daily.    . vitamin B-12 (CYANOCOBALAMIN) 1000 MCG tablet Take 1,000 mcg by mouth daily.      No current facility-administered medications for this encounter.     ECOG PERFORMANCE STATUS:  0 - Asymptomatic  REVIEW OF SYSTEMS:  Patient denies any weight loss, fatigue, weakness, fever, chills or night sweats. Patient denies any loss of vision, blurred vision. Patient denies any ringing  of the ears or hearing loss. No irregular heartbeat. Patient denies heart murmur or history of fainting. Patient denies any chest pain or pain radiating to her upper extremities. Patient denies any shortness of breath, difficulty breathing at night, cough or hemoptysis. Patient denies any swelling in the lower legs. Patient denies any nausea vomiting, vomiting of blood, or coffee ground material in the vomitus. Patient denies any stomach pain. Patient states has had normal bowel movements no significant constipation or diarrhea. Patient denies any dysuria, hematuria or significant nocturia. Patient denies any problems walking, swelling in the joints or loss of balance. Patient denies any skin changes, loss of hair or loss of weight. Patient denies any excessive worrying or anxiety or significant depression. Patient denies any problems with insomnia. Patient denies excessive thirst, polyuria, polydipsia. Patient denies any swollen glands, patient denies easy bruising or easy bleeding. Patient denies any recent infections, allergies or URI. Patient "s visual fields have not changed significantly in recent time.    PHYSICAL EXAM: There were no vitals taken for this visit. Left breast is  wide local excision site the 2:30 position which is healing well. No dominant mass or nodularity is noted in either breast in 2 positions examined. No axillary or supraclavicular adenopathy is identified.Well-developed well-nourished patient in NAD. HEENT reveals PERLA, EOMI, discs not visualized.  Oral cavity is clear. No oral mucosal lesions are identified. Neck is clear without evidence of cervical or supraclavicular adenopathy. Lungs are clear to A&P. Cardiac examination is essentially unremarkable with regular rate and rhythm without murmur rub or thrill. Abdomen is benign with no organomegaly or masses noted. Motor sensory and DTR levels are equal and symmetric in the upper and lower extremities. Cranial nerves II through XII are grossly intact. Proprioception is intact. No peripheral adenopathy or edema is identified. No motor or sensory levels are noted. Crude visual fields are within normal range.  LABORATORY DATA: pathology reports reviewed    RADIOLOGY RESULTS:mammogram and ultrasound reviewed   IMPRESSION: stage I invasive mammary carcinoma well-differentiated stage TI be status post wide local excision and sentinel node biopsy in 67 year old female  PLAN: at this time we will wait final pathology report for ER/PR status. I have recommended whole breast radiation. I believe  we can do this in 4 week course using hypofractionated course of treatment. Would also want to boost her scar another 1600 cGy using electron beam based on her close margin. Risks and benefits of radiation including fatigue skin changes possible inclusion of superficial lung alteration of blood counts all were discussed in detail with the patient and her husband. Both seem to compress my treatment plan well. I personally set up and ordered CT some relation for next week we'll be sure to check her ER/PR status prior to simulation. Should she need systemic chemotherapy we'll sequence radiation after completion of  chemotherapy.  I would like to take this opportunity to thank you for allowing me to participate in the care of your patient.Noreene Filbert, MD

## 2017-11-21 NOTE — Progress Notes (Signed)
No new changes noted today 

## 2017-11-22 DIAGNOSIS — Z7189 Other specified counseling: Secondary | ICD-10-CM | POA: Insufficient documentation

## 2017-11-22 LAB — SURGICAL PATHOLOGY

## 2017-11-22 NOTE — Progress Notes (Signed)
Hematology/Oncology Consult note [facility]  Telephone:([phone] [phone]  Patient Care Team: Rusty Aus, MD as PCP - General (Internal Medicine)   Name of the patient: Megan Briggs  [id number]  01-Dec-1950   Date of visit: 11/22/17  Diagnosis- Stage I left breast cancer pathological prognostic stage pending based on ER PR status  Chief complaint/ Reason for visit- discuss pathology results and further management  Heme/Onc history:  Patient is a 67 year old seen in the past for normocytic anemia.  She is now been referred to me for new diagnosis of breast cancer.  Patient had a screening mammogram on 10/16/2017 which showed suspicious mass in the upper outer quadrant of the left breast 6 x 5 x 4 mm in size which was also confirmed on ultrasound.  No evidence of left axillary adenopathy.  Patient underwent biopsy of the breast mass which showed grade 1, 1 mm invasive mammary carcinoma.  ER PR status was not obtained due to the small size of the breast biopsy.  Patient has already seen Dr. Lysle Pearl and is scheduled to undergo a lumpectomy on 11/14/2017.  Interval history-overall she is doing well.  She denies any specific complaints today.  Reports some soreness at the lumpectomy site.  ECOG PS- 0 Pain scale- 0   Review of systems- Review of Systems  Constitutional: Negative for chills, fever, malaise/fatigue and weight loss.  HENT: Negative for congestion, ear discharge and nosebleeds.   Eyes: Negative for blurred vision.  Respiratory: Negative for cough, hemoptysis, sputum production, shortness of breath and wheezing.   Cardiovascular: Negative for chest pain, palpitations, orthopnea and claudication.  Gastrointestinal: Negative for abdominal pain, blood in stool, constipation, diarrhea, heartburn, melena, nausea and vomiting.  Genitourinary: Negative for dysuria, flank pain, frequency, hematuria and urgency.  Musculoskeletal: Negative for  back pain, joint pain and myalgias.  Skin: Negative for rash.  Neurological: Negative for dizziness, tingling, focal weakness, seizures, weakness and headaches.  Endo/Heme/Allergies: Does not bruise/bleed easily.  Psychiatric/Behavioral: Negative for depression and suicidal ideas. The patient does not have insomnia.       Allergies  Allergen Reactions  . Ace Inhibitors Anaphylaxis and Swelling    Lisinopril   . Tape Other (See Comments)    Band-aids caused redness and irritation     Past Medical History:  Diagnosis Date  . Anemia   . Cancer ([place])    Left breast  . Glaucoma   . Hypertension   . Neuropathy      Past Surgical History:  Procedure Laterality Date  . BACK SURGERY    . BREAST BIOPSY Right 1995   benign  . BREAST BIOPSY Left 11/01/2017   US guided biopsy - ribbon clip  . BREAST LUMPECTOMY Left 11/14/2017  . BREAST LUMPECTOMY WITH NEEDLE LOCALIZATION Left 11/14/2017   Procedure: BREAST LUMPECTOMY WITH NEEDLE LOCALIZATION;  Surgeon: Benjamine Sprague, DO;  Location: [facility] ORS;  Service: General;  Laterality: Left;  . COLONOSCOPY WITH PROPOFOL    . SENTINEL NODE BIOPSY Left 11/14/2017   Procedure: SENTINEL NODE BIOPSY;  Surgeon: Benjamine Sprague, DO;  Location: [facility] ORS;  Service: General;  Laterality: Left;    Social History   Socioeconomic History  . Marital status: Married    Spouse name: Not on file  . Number of children: Not on file  . Years of education: Not on file  . Highest education level: Not on file  Occupational History  . Not on file  Social Needs  .  Financial resource strain: Not on file  . Food insecurity:    Worry: Not on file    Inability: Not on file  . Transportation needs:    Medical: Not on file    Non-medical: Not on file  Tobacco Use  . Smoking status: Former Smoker    Packs/day: 0.25    Last attempt to quit: 06/08/1970    Years since quitting: 47.4  . Smokeless tobacco: Never Used  Substance and Sexual Activity  . Alcohol use:  Yes    Alcohol/week: 3.0 standard drinks    Types: 3 Glasses of wine per week    Comment: 1 glass of wine daily.  . Drug use: No  . Sexual activity: Yes  Lifestyle  . Physical activity:    Days per week: Not on file    Minutes per session: Not on file  . Stress: Not on file  Relationships  . Social connections:    Talks on phone: Not on file    Gets together: Not on file    Attends religious service: Not on file    Active member of club or organization: Not on file    Attends meetings of clubs or organizations: Not on file    Relationship status: Not on file  . Intimate partner violence:    Fear of current or ex partner: Not on file    Emotionally abused: Not on file    Physically abused: Not on file    Forced sexual activity: Not on file  Other Topics Concern  . Not on file  Social History Narrative  . Not on file    Family History  Problem Relation Age of Onset  . Breast cancer Cousin   . Breast cancer Cousin   . Breast cancer Paternal Aunt      Current Outpatient Medications:  .  azelastine (ASTELIN) 0.1 % nasal spray, Place 1 spray into both nostrils 2 (two) times daily. Use in each nostril as directed, Disp: , Rfl:  .  bimatoprost (LUMIGAN) 0.01 % SOLN, Place 1 drop into both eyes at bedtime., Disp: , Rfl:  .  Cholecalciferol (VITAMIN D) 2000 units tablet, Take 2,000 Units by mouth daily., Disp: , Rfl:  .  gabapentin (NEURONTIN) 300 MG capsule, Take 600 mg by mouth at bedtime. , Disp: , Rfl:  .  meloxicam (MOBIC) 7.5 MG tablet, Take 7.5 mg by mouth daily. , Disp: , Rfl:  .  olmesartan-hydrochlorothiazide (BENICAR HCT) 20-12.5 MG tablet, Take 0.5 tablets by mouth daily., Disp: , Rfl:  .  pantoprazole (PROTONIX) 40 MG tablet, Take 40 mg by mouth daily. , Disp: , Rfl:  .  timolol (BETIMOL) 0.5 % ophthalmic solution, Place 1 drop into both eyes 2 (two) times daily., Disp: , Rfl:  .  Turmeric 500 MG CAPS, Take 1 capsule by mouth daily., Disp: , Rfl:  .  vitamin B-12  (CYANOCOBALAMIN) 1000 MCG tablet, Take 1,000 mcg by mouth daily. , Disp: , Rfl:  .  acetaminophen (TYLENOL) 325 MG tablet, Take 2 tablets (650 mg total) by mouth at bedtime for 10 days. (Patient not taking: Reported on 11/21/2017), Disp: 20 tablet, Rfl: 0 .  docusate sodium (COLACE) 100 MG capsule, Take 1 capsule (100 mg total) by mouth 2 (two) times daily as needed for up to 10 days for mild constipation. (Patient not taking: Reported on 11/21/2017), Disp: 20 capsule, Rfl: 0 .  ibuprofen (ADVIL,MOTRIN) 800 MG tablet, Take 1 tablet (800 mg total) by mouth every  8 (eight) hours as needed for mild pain or moderate pain. (Patient not taking: Reported on 11/21/2017), Disp: 30 tablet, Rfl: 0 .  sennosides-docusate sodium (SENOKOT-S) 8.6-50 MG tablet, Take 2 tablets by mouth at bedtime., Disp: , Rfl:   Physical exam:  Vitals:   11/21/17 0942  BP: (!) 152/81  Pulse: (!) 55  Resp: 18  Temp: (!) 96.2 F (35.7 C)  TempSrc: Tympanic  SpO2: 97%  Weight: 159 lb 3.2 oz (72.2 kg)  Height: '5\' 6"'$  (1.676 m)   Physical Exam  Constitutional: She is oriented to person, place, and time. She appears well-developed and well-nourished.  HENT:  Head: Normocephalic and atraumatic.  Eyes: Pupils are equal, round, and reactive to light. EOM are normal.  Neck: Normal range of motion.  Cardiovascular: Normal rate, regular rhythm and normal heart sounds.  Pulmonary/Chest: Effort normal and breath sounds normal.  Abdominal: Soft. Bowel sounds are normal.  Neurological: She is alert and oriented to person, place, and time.  Skin: Skin is warm and dry.  Patient is status post left lumpectomy with a surgical scar that is healing well.  No evidence of inflammation or local seroma  CMP Latest Ref Rng & Units 06/07/2016  Glucose 65 - 99 mg/dL 91  BUN 6 - 20 mg/dL 26(H)  Creatinine 0.44 - 1.00 mg/dL 1.12(H)  Sodium 135 - 145 mmol/L 135  Potassium 3.5 - 5.1 mmol/L 4.3  Chloride 101 - 111 mmol/L 101  CO2 22 - 32 mmol/L  29  Calcium 8.9 - 10.3 mg/dL 9.7  Total Protein 6.5 - 8.1 g/dL 7.7  Total Bilirubin 0.3 - 1.2 mg/dL 0.6  Alkaline Phos 38 - 126 U/L 67  AST 15 - 41 U/L 23  ALT 14 - 54 U/L 27   CBC Latest Ref Rng & Units 06/07/2016  WBC 3.6 - 11.0 K/uL 3.7  Hemoglobin 12.0 - 16.0 g/dL 10.8(L)  Hematocrit 35.0 - 47.0 % 32.0(L)  Platelets 150 - 440 K/uL 245    No images are attached to the encounter.  Nm Sentinel Node Injection  Result Date: 11/14/2017 CLINICAL DATA:  Left breast cancer. EXAM: NUCLEAR MEDICINE BREAST LYMPHOSCINTIGRAPHY left breast TECHNIQUE: Intradermal injection of radiopharmaceutical was performed at the 12 o'clock, 3 o'clock, 6 o'clock, and 9 o'clock positions around the left nipple. The patient was then sent to the operating room where the sentinel node(s) were identified and removed by the surgeon. RADIOPHARMACEUTICALS:  Total of 1 mCi Millipore-filtered Technetium-36msulfur colloid, injected in four aliquots of 0.25 mCi each. IMPRESSION: Uncomplicated intradermal injection of a total of 1 mCi Technetium-931mulfur colloid for purposes of sentinel node identification. Electronically Signed   By: David  JoMartinique.D.   On: 11/14/2017 10:07   Mm Breast Surgical Specimen  Result Date: 11/14/2017 CLINICAL DATA:  Patient is post wire localization subsequent surgical excision of biopsy-proven left breast malignancy. EXAM: SPECIMEN RADIOGRAPH OF THE LEFT BREAST COMPARISON:  Previous exam(s). FINDINGS: Status post excision of the left breast. The wire tip and biopsy marker clip are present and are marked for pathology. Results were called to the OR and discussed with Dr. SaLysle Pearlt the time of dictation. IMPRESSION: Specimen radiograph of the left breast. Electronically Signed   By: DaMarin Olp.D.   On: 11/14/2017 12:30   Mm Clip Placement Left  Result Date: 11/01/2017 CLINICAL DATA:  Confirmation of clip placement after ultrasound-guided core needle biopsy of a 6 mm mass associated with  architectural distortion in the [place]  the LEFT breast at POSTERIOR depth. EXAM: DIAGNOSTIC LEFT MAMMOGRAM POST ULTRASOUND BIOPSY COMPARISON:  Previous exam(s). FINDINGS: Mammographic images were obtained following ultrasound guided biopsy of a mass in the [place] of the LEFT breast at POSTERIOR depth. The ribbon shaped tissue marker clip migrated approximately 7 mm anteromedial to the biopsied mass in the [place]. Expected post biopsy changes are present without evidence of hematoma. IMPRESSION: Approximate 7 mm anteromedial migration of the ribbon shaped tissue marker clip relative to the biopsied mass in the UPPER OUTER QUADRANT of the LEFT breast at POSTERIOR depth. Final Assessment: Post Procedure Mammograms for Marker Placement Electronically Signed   By: Evangeline Dakin M.D.   On: 11/01/2017 09:17   [place] Lt Breast Bx W Loc Dev 1st Lesion Img Bx Spec US Guide  Addendum Date: 11/04/2017   ADDENDUM REPORT: 11/04/2017 13:50 ADDENDUM: PATHOLOGY ADDENDUM: Pathology: Invasive mammary carcinoma Pathology concordant with imaging findings: Yes Recommendation: Surgical excision Localization/excision considerations: Wire localization Per Dr. Ammie Ferrier preference, the pathology results will be communicated by his nurse Glenda. Electronically Signed   By: Fidela Salisbury M.D.   On: 11/04/2017 13:50   Result Date: 11/04/2017 CLINICAL DATA:  Screening detected 6 mm mass associated with architectural distortion involving the UPPER OUTER QUADRANT of the LEFT breast at POSTERIOR depth, 2:30 o'clock approximately 5 cm from the nipple. EXAM: ULTRASOUND GUIDED LEFT BREAST CORE NEEDLE BIOPSY COMPARISON:  Previous exam(s). FINDINGS: I met with the patient and we discussed the procedure of ultrasound-guided biopsy, including benefits and alternatives. We discussed the high likelihood of a successful procedure. We discussed the risks of the procedure, including infection, bleeding,  tissue injury, clip migration, and inadequate sampling. Informed written consent was given. The usual time-out protocol was performed immediately prior to the procedure. Lesion quadrant: UPPER OUTER QUADRANT. Using sterile technique with chlorhexidine as skin antisepsis, 1% Lidocaine as local anesthetic, under direct ultrasound visualization, a 12 gauge Bard Marquee core needle device was used to perform biopsy of the mass in the UPPER OUTER QUADRANT of the LEFT breast using a LATERAL approach. At the conclusion of the procedure a ribbon shaped tissue marker clip was deployed into the biopsy cavity. Follow up 2 view mammogram was performed and dictated separately. IMPRESSION: Ultrasound guided biopsy of a suspicious 6 mm mass involving the UPPER OUTER QUADRANT of the LEFT breast. No apparent complications. Electronically Signed: By: Evangeline Dakin M.D. On: 11/01/2017 09:16   Mm Lt Plc Breast Loc Dev   1st Lesion  Inc Mammo Guide  Result Date: 11/14/2017 CLINICAL DATA:  Patient presents for needle localization prior to surgical excision of biopsy-proven invasive ductal carcinoma upper outer left breast. EXAM: NEEDLE LOCALIZATION OF THE LEFT BREAST WITH MAMMO GUIDANCE COMPARISON:  Previous exams. FINDINGS: Patient presents for needle localization prior to surgical excision. I met with the patient and we discussed the procedure of needle localization including benefits and alternatives. We discussed the high likelihood of a successful procedure. We discussed the risks of the procedure, including infection, bleeding, tissue injury, and further surgery. Informed, written consent was given. The usual time-out protocol was performed immediately prior to the procedure. Using mammographic guidance, sterile technique, 1% lidocaine and a 9 cm modified Kopans needle, the targeted mass/biopsy clip were localized using lateral to medial approach. The wire passes 5 mm posterior to the clip on the CC image and just superior  to the clip on the ML image. The targeted mass/clip lie adjacent the proximal end of the reinforced  segment of the wire. The images were marked for Dr. Lysle Pearl. IMPRESSION: Needle localization left breast. No apparent complications. Electronically Signed   By: Marin Olp M.D.   On: 11/14/2017 08:44     Assessment and plan- Patient is a 67 y.o. female with invasive mammary carcinoma of the left breast clinical prognostic stage IA cT1b cN0 M0.  She is status post left lumpectomy.  Pathological prognostic staging pending based on ER PR status  ER PR was not done on her original core biopsy as it was a small specimen.  Final pathology showed 7 mm grade 1 tumor with negative margins and negative sentinel lymph nodes.  No evidence of lymphovascular invasion. pT1b pN0.  At this time we are awaiting results of ER PR status which are likely to be positive given that she has grade 1 tumor.  Discussed with the patient that I will give her a call once the ER PR status is back.  If she is ER PR positive and HER-2/neu negative she does not need chemotherapy as it was a grade 1 tumor less than 10 mm.  She can then proceed for adjuvant radiation therapy and I will discuss endocrine therapy with her in more detail as she is coming towards the end of radiation therapy.  We will also obtain a baseline bone density scan in that case.  If the ER PR status is negative we will need to consider adjuvant chemotherapy in which case I will see the patient sooner.  Treatment will be given with a curative intent  We will make a follow-up appointment with her based on the results of ER PR status  Patient will be seen radiation oncology later today.   Total face to face encounter time for this patient visit was 30 min. >50% of the time was  spent in counseling and coordination of care.      Visit Diagnosis 1. Goals of care, counseling/discussion   2. Malignant neoplasm of upper-outer quadrant of left female breast,  unspecified estrogen receptor status ([place])      Dr. Randa Evens, MD, MPH [facility] at [facility] [phone] 11/22/2017 8:16 AM

## 2017-11-25 ENCOUNTER — Telehealth: Payer: Self-pay | Admitting: *Deleted

## 2017-11-25 NOTE — Telephone Encounter (Signed)
Patient called asking for results form her lumpectomy/ Please return call to [phone]. She has no follow up with you, but is down for Tumor Conference today

## 2017-11-25 NOTE — Telephone Encounter (Signed)
Can you please call her

## 2017-11-25 NOTE — Telephone Encounter (Signed)
I called her and gave results and made appt for her in 6 weeks. I put it in telephone call note

## 2017-11-25 NOTE — Telephone Encounter (Signed)
Called pt to let her know that ER and PR was positive and HER 2 neg. She will not need chemo and she will proceed with radiation.  When she gets her scheduled I have given her my number so that she can call me and we will r/s her appt with [place] in 6 weeks . We would prefer it to be at the last week of radiation and to be near her appt time with radiation for her convenience. She will call me when she gets a schedule.she is agreeable to this plan

## 2017-11-28 ENCOUNTER — Ambulatory Visit
Admission: RE | Admit: 2017-11-28 | Discharge: 2017-11-28 | Disposition: A | Discharge status: Home / Self Care | Payer: Medicare PPO | Source: Ambulatory Visit | Attending: Radiation Oncology | Admitting: Radiation Oncology

## 2017-11-28 DIAGNOSIS — C50412 Malignant neoplasm of upper-outer quadrant of left female breast: Secondary | ICD-10-CM | POA: Diagnosis not present

## 2017-11-29 DIAGNOSIS — C50412 Malignant neoplasm of upper-outer quadrant of left female breast: Secondary | ICD-10-CM | POA: Insufficient documentation

## 2017-12-06 ENCOUNTER — Other Ambulatory Visit: Payer: Self-pay | Admitting: *Deleted

## 2017-12-06 DIAGNOSIS — C50412 Malignant neoplasm of upper-outer quadrant of left female breast: Secondary | ICD-10-CM

## 2017-12-09 ENCOUNTER — Ambulatory Visit
Admission: RE | Admit: 2017-12-09 | Discharge: 2017-12-09 | Disposition: A | Discharge status: Home / Self Care | Payer: Medicare PPO | Source: Ambulatory Visit | Attending: Radiation Oncology | Admitting: Radiation Oncology

## 2017-12-09 DIAGNOSIS — C50412 Malignant neoplasm of upper-outer quadrant of left female breast: Secondary | ICD-10-CM | POA: Diagnosis not present

## 2017-12-10 ENCOUNTER — Ambulatory Visit
Admission: RE | Admit: 2017-12-10 | Discharge: 2017-12-10 | Disposition: A | Discharge status: Home / Self Care | Payer: Medicare PPO | Source: Ambulatory Visit | Attending: Radiation Oncology | Admitting: Radiation Oncology

## 2017-12-10 DIAGNOSIS — C50412 Malignant neoplasm of upper-outer quadrant of left female breast: Secondary | ICD-10-CM | POA: Diagnosis not present

## 2017-12-11 ENCOUNTER — Ambulatory Visit
Admission: RE | Admit: 2017-12-11 | Discharge: 2017-12-11 | Disposition: A | Discharge status: Home / Self Care | Payer: Medicare PPO | Source: Ambulatory Visit | Attending: Radiation Oncology | Admitting: Radiation Oncology

## 2017-12-11 DIAGNOSIS — C50412 Malignant neoplasm of upper-outer quadrant of left female breast: Secondary | ICD-10-CM | POA: Diagnosis not present

## 2017-12-12 ENCOUNTER — Ambulatory Visit: Payer: Medicare PPO

## 2017-12-13 ENCOUNTER — Ambulatory Visit
Admission: RE | Admit: 2017-12-13 | Discharge: 2017-12-13 | Disposition: A | Discharge status: Home / Self Care | Payer: Medicare PPO | Source: Ambulatory Visit | Attending: Radiation Oncology | Admitting: Radiation Oncology

## 2017-12-13 DIAGNOSIS — C50412 Malignant neoplasm of upper-outer quadrant of left female breast: Secondary | ICD-10-CM | POA: Diagnosis not present

## 2017-12-16 ENCOUNTER — Ambulatory Visit
Admission: RE | Admit: 2017-12-16 | Discharge: 2017-12-16 | Disposition: A | Discharge status: Home / Self Care | Payer: Medicare PPO | Source: Ambulatory Visit | Attending: Radiation Oncology | Admitting: Radiation Oncology

## 2017-12-16 DIAGNOSIS — C50412 Malignant neoplasm of upper-outer quadrant of left female breast: Secondary | ICD-10-CM | POA: Diagnosis not present

## 2017-12-17 ENCOUNTER — Ambulatory Visit
Admission: RE | Admit: 2017-12-17 | Discharge: 2017-12-17 | Disposition: A | Discharge status: Home / Self Care | Payer: Medicare PPO | Source: Ambulatory Visit | Attending: Radiation Oncology | Admitting: Radiation Oncology

## 2017-12-17 DIAGNOSIS — C50412 Malignant neoplasm of upper-outer quadrant of left female breast: Secondary | ICD-10-CM | POA: Diagnosis not present

## 2017-12-18 ENCOUNTER — Ambulatory Visit
Admission: RE | Admit: 2017-12-18 | Discharge: 2017-12-18 | Disposition: A | Discharge status: Home / Self Care | Payer: Medicare PPO | Source: Ambulatory Visit | Attending: Radiation Oncology | Admitting: Radiation Oncology

## 2017-12-18 DIAGNOSIS — C50412 Malignant neoplasm of upper-outer quadrant of left female breast: Secondary | ICD-10-CM | POA: Diagnosis not present

## 2017-12-19 ENCOUNTER — Ambulatory Visit
Admission: RE | Admit: 2017-12-19 | Discharge: 2017-12-19 | Disposition: A | Discharge status: Home / Self Care | Payer: Medicare PPO | Source: Ambulatory Visit | Attending: Radiation Oncology | Admitting: Radiation Oncology

## 2017-12-19 DIAGNOSIS — C50412 Malignant neoplasm of upper-outer quadrant of left female breast: Secondary | ICD-10-CM | POA: Diagnosis not present

## 2017-12-20 ENCOUNTER — Ambulatory Visit
Admission: RE | Admit: 2017-12-20 | Discharge: 2017-12-20 | Disposition: A | Discharge status: Home / Self Care | Payer: Medicare PPO | Source: Ambulatory Visit | Attending: Radiation Oncology | Admitting: Radiation Oncology

## 2017-12-20 DIAGNOSIS — C50412 Malignant neoplasm of upper-outer quadrant of left female breast: Secondary | ICD-10-CM | POA: Diagnosis not present

## 2017-12-23 ENCOUNTER — Ambulatory Visit
Admission: RE | Admit: 2017-12-23 | Discharge: 2017-12-23 | Disposition: A | Discharge status: Home / Self Care | Payer: Medicare PPO | Source: Ambulatory Visit | Attending: Radiation Oncology | Admitting: Radiation Oncology

## 2017-12-23 ENCOUNTER — Inpatient Hospital Stay: Payer: Medicare PPO | Attending: Oncology

## 2017-12-23 DIAGNOSIS — C50412 Malignant neoplasm of upper-outer quadrant of left female breast: Secondary | ICD-10-CM | POA: Diagnosis present

## 2017-12-23 LAB — CBC
HCT: 33.2 % — ABNORMAL LOW (ref 36.0–46.0)
Hemoglobin: 10.5 g/dL — ABNORMAL LOW (ref 12.0–15.0)
MCH: 31 pg (ref 26.0–34.0)
MCHC: 31.6 g/dL (ref 30.0–36.0)
MCV: 97.9 fL (ref 80.0–100.0)
NRBC: 0 % (ref 0.0–0.2)
PLATELETS: 206 10*3/uL (ref 150–400)
RBC: 3.39 MIL/uL — AB (ref 3.87–5.11)
RDW: 12.5 % (ref 11.5–15.5)
WBC: 3.5 10*3/uL — ABNORMAL LOW (ref 4.0–10.5)

## 2017-12-24 ENCOUNTER — Ambulatory Visit
Admission: RE | Admit: 2017-12-24 | Discharge: 2017-12-24 | Disposition: A | Discharge status: Home / Self Care | Payer: Medicare PPO | Source: Ambulatory Visit | Attending: Radiation Oncology | Admitting: Radiation Oncology

## 2017-12-24 DIAGNOSIS — C50412 Malignant neoplasm of upper-outer quadrant of left female breast: Secondary | ICD-10-CM | POA: Diagnosis not present

## 2017-12-25 ENCOUNTER — Ambulatory Visit
Admission: RE | Admit: 2017-12-25 | Discharge: 2017-12-25 | Disposition: A | Discharge status: Home / Self Care | Payer: Medicare PPO | Source: Ambulatory Visit | Attending: Radiation Oncology | Admitting: Radiation Oncology

## 2017-12-25 DIAGNOSIS — C50412 Malignant neoplasm of upper-outer quadrant of left female breast: Secondary | ICD-10-CM | POA: Diagnosis not present

## 2017-12-30 ENCOUNTER — Ambulatory Visit
Admission: RE | Admit: 2017-12-30 | Discharge: 2017-12-30 | Disposition: A | Discharge status: Home / Self Care | Payer: Medicare PPO | Source: Ambulatory Visit | Attending: Radiation Oncology | Admitting: Radiation Oncology

## 2017-12-30 DIAGNOSIS — C50412 Malignant neoplasm of upper-outer quadrant of left female breast: Secondary | ICD-10-CM | POA: Insufficient documentation

## 2017-12-31 ENCOUNTER — Ambulatory Visit
Admission: RE | Admit: 2017-12-31 | Discharge: 2017-12-31 | Disposition: A | Discharge status: Home / Self Care | Payer: Medicare PPO | Source: Ambulatory Visit | Attending: Radiation Oncology | Admitting: Radiation Oncology

## 2017-12-31 DIAGNOSIS — C50412 Malignant neoplasm of upper-outer quadrant of left female breast: Secondary | ICD-10-CM | POA: Diagnosis not present

## 2018-01-01 ENCOUNTER — Ambulatory Visit
Admission: RE | Admit: 2018-01-01 | Discharge: 2018-01-01 | Disposition: A | Discharge status: Home / Self Care | Payer: Medicare PPO | Source: Ambulatory Visit | Attending: Radiation Oncology | Admitting: Radiation Oncology

## 2018-01-01 DIAGNOSIS — C50412 Malignant neoplasm of upper-outer quadrant of left female breast: Secondary | ICD-10-CM | POA: Diagnosis not present

## 2018-01-02 ENCOUNTER — Ambulatory Visit
Admission: RE | Admit: 2018-01-02 | Discharge: 2018-01-02 | Disposition: A | Discharge status: Home / Self Care | Payer: Medicare PPO | Source: Ambulatory Visit | Attending: Radiation Oncology | Admitting: Radiation Oncology

## 2018-01-02 DIAGNOSIS — C50412 Malignant neoplasm of upper-outer quadrant of left female breast: Secondary | ICD-10-CM | POA: Diagnosis not present

## 2018-01-03 ENCOUNTER — Ambulatory Visit: Payer: Medicare PPO

## 2018-01-03 ENCOUNTER — Ambulatory Visit
Admission: RE | Admit: 2018-01-03 | Discharge: 2018-01-03 | Disposition: A | Discharge status: Home / Self Care | Payer: Medicare PPO | Source: Ambulatory Visit | Attending: Radiation Oncology | Admitting: Radiation Oncology

## 2018-01-03 DIAGNOSIS — C50412 Malignant neoplasm of upper-outer quadrant of left female breast: Secondary | ICD-10-CM | POA: Diagnosis not present

## 2018-01-06 ENCOUNTER — Inpatient Hospital Stay: Payer: Medicare PPO | Attending: Oncology

## 2018-01-06 ENCOUNTER — Ambulatory Visit
Admission: RE | Admit: 2018-01-06 | Discharge: 2018-01-06 | Disposition: A | Discharge status: Home / Self Care | Payer: Medicare PPO | Source: Ambulatory Visit | Attending: Radiation Oncology | Admitting: Radiation Oncology

## 2018-01-06 DIAGNOSIS — Z800 Family history of malignant neoplasm of digestive organs: Secondary | ICD-10-CM | POA: Insufficient documentation

## 2018-01-06 DIAGNOSIS — Z87891 Personal history of nicotine dependence: Secondary | ICD-10-CM | POA: Insufficient documentation

## 2018-01-06 DIAGNOSIS — I10 Essential (primary) hypertension: Secondary | ICD-10-CM | POA: Diagnosis not present

## 2018-01-06 DIAGNOSIS — Z780 Asymptomatic menopausal state: Secondary | ICD-10-CM | POA: Insufficient documentation

## 2018-01-06 DIAGNOSIS — C50412 Malignant neoplasm of upper-outer quadrant of left female breast: Secondary | ICD-10-CM

## 2018-01-06 DIAGNOSIS — Z170 Estrogen receptor positive status [ER+]: Secondary | ICD-10-CM | POA: Diagnosis not present

## 2018-01-06 DIAGNOSIS — Z923 Personal history of irradiation: Secondary | ICD-10-CM | POA: Diagnosis not present

## 2018-01-06 DIAGNOSIS — Z7189 Other specified counseling: Secondary | ICD-10-CM | POA: Insufficient documentation

## 2018-01-06 LAB — CBC
HEMATOCRIT: 32.9 % — AB (ref 36.0–46.0)
Hemoglobin: 10.3 g/dL — ABNORMAL LOW (ref 12.0–15.0)
MCH: 30.9 pg (ref 26.0–34.0)
MCHC: 31.3 g/dL (ref 30.0–36.0)
MCV: 98.8 fL (ref 80.0–100.0)
PLATELETS: 211 10*3/uL (ref 150–400)
RBC: 3.33 MIL/uL — ABNORMAL LOW (ref 3.87–5.11)
RDW: 12.5 % (ref 11.5–15.5)
WBC: 3.4 10*3/uL — ABNORMAL LOW (ref 4.0–10.5)
nRBC: 0 % (ref 0.0–0.2)

## 2018-01-07 ENCOUNTER — Ambulatory Visit: Payer: Medicare PPO | Admitting: Oncology

## 2018-01-07 ENCOUNTER — Ambulatory Visit
Admission: RE | Admit: 2018-01-07 | Discharge: 2018-01-07 | Disposition: A | Discharge status: Home / Self Care | Payer: Medicare PPO | Source: Ambulatory Visit | Attending: Radiation Oncology | Admitting: Radiation Oncology

## 2018-01-07 DIAGNOSIS — C50412 Malignant neoplasm of upper-outer quadrant of left female breast: Secondary | ICD-10-CM | POA: Diagnosis not present

## 2018-01-08 ENCOUNTER — Ambulatory Visit
Admission: RE | Admit: 2018-01-08 | Discharge: 2018-01-08 | Disposition: A | Discharge status: Home / Self Care | Payer: Medicare PPO | Source: Ambulatory Visit | Attending: Radiation Oncology | Admitting: Radiation Oncology

## 2018-01-08 DIAGNOSIS — C50412 Malignant neoplasm of upper-outer quadrant of left female breast: Secondary | ICD-10-CM | POA: Diagnosis not present

## 2018-01-09 ENCOUNTER — Encounter: Payer: Self-pay | Admitting: *Deleted

## 2018-01-09 ENCOUNTER — Ambulatory Visit: Payer: Medicare PPO | Admitting: Oncology

## 2018-01-09 ENCOUNTER — Ambulatory Visit
Admission: RE | Admit: 2018-01-09 | Discharge: 2018-01-09 | Disposition: A | Discharge status: Home / Self Care | Payer: Medicare PPO | Source: Ambulatory Visit | Attending: Radiation Oncology | Admitting: Radiation Oncology

## 2018-01-09 DIAGNOSIS — C50412 Malignant neoplasm of upper-outer quadrant of left female breast: Secondary | ICD-10-CM | POA: Diagnosis not present

## 2018-01-10 ENCOUNTER — Ambulatory Visit: Payer: Medicare PPO | Admitting: Oncology

## 2018-01-10 ENCOUNTER — Ambulatory Visit
Admission: RE | Admit: 2018-01-10 | Discharge: 2018-01-10 | Disposition: A | Discharge status: Home / Self Care | Payer: Medicare PPO | Source: Ambulatory Visit | Attending: Radiation Oncology | Admitting: Radiation Oncology

## 2018-01-10 DIAGNOSIS — C50412 Malignant neoplasm of upper-outer quadrant of left female breast: Secondary | ICD-10-CM | POA: Diagnosis not present

## 2018-01-13 ENCOUNTER — Ambulatory Visit
Admission: RE | Admit: 2018-01-13 | Discharge: 2018-01-13 | Disposition: A | Discharge status: Home / Self Care | Payer: Medicare PPO | Source: Ambulatory Visit | Attending: Radiation Oncology | Admitting: Radiation Oncology

## 2018-01-13 DIAGNOSIS — C50412 Malignant neoplasm of upper-outer quadrant of left female breast: Secondary | ICD-10-CM | POA: Diagnosis not present

## 2018-01-13 NOTE — Progress Notes (Signed)
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 She is now been referred to me for new diagnosis of breast cancer. Patient had a screening mammogram on 10/16/2017 which showed suspicious mass in the upper outer quadrant of the left breast 6 x 5 x 4 mm in size which was also confirmed on ultrasound. No evidence of left axillary adenopathy. Patient underwent biopsy of the breast mass which showed grade 1, 1 mm invasive mammary carcinoma. ER PR status was not obtained due to the small size of the breast biopsy.  Patient underwent lumpectomy and sentinel lymph node biopsy on 11/14/2017.  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  One sentinel lymph node was negative for malignancy.  pT1b pN0.  Given that her tumor was less than 1 cm and grade 1 she did not require any Oncotype testing and adjuvant chemotherapy.  Patient seen by radiation oncology and completes adjuvant radiation tomorrow.  Interval history-she is finishing her adjuvant radiation tomorrow.  She reports some burning at the site of her radiation treatment but denies other complaints  ECOG PS- 0 Pain scale- 0 Opioid associated constipation- no  Review of systems- Review of Systems  Constitutional: Negative for chills, fever, malaise/fatigue and weight loss.  HENT: Negative for congestion, ear  discharge and nosebleeds.   Eyes: Negative for blurred vision.  Respiratory: Negative for cough, hemoptysis, sputum production, shortness of breath and wheezing.   Cardiovascular: Negative for chest pain, palpitations, orthopnea and claudication.  Gastrointestinal: Negative for abdominal pain, blood in stool, constipation, diarrhea, heartburn, melena, nausea and vomiting.  Genitourinary: Negative for dysuria, flank pain, frequency, hematuria and urgency.  Musculoskeletal: Negative for back pain, joint pain and myalgias.  Skin: Negative for rash.  Neurological: Negative for dizziness, tingling, focal weakness, seizures, weakness and headaches.  Endo/Heme/Allergies: Does not bruise/bleed easily.  Psychiatric/Behavioral: Negative for depression and suicidal ideas. The patient does not have insomnia.        Allergies  Allergen Reactions  . Ace Inhibitors Anaphylaxis and Swelling    Lisinopril   . Tape Other (See Comments)    Band-aids caused redness and irritation     Past Medical History:  Diagnosis Date  . Anemia   . Cancer ([place])    Left breast  . Glaucoma   . Hypertension   . Neuropathy      Past Surgical History:  Procedure Laterality Date  . BACK SURGERY    . BREAST BIOPSY Right 1995   benign  . BREAST BIOPSY Left 11/01/2017   US guided biopsy - ribbon clip  . BREAST LUMPECTOMY Left 11/14/2017  . BREAST LUMPECTOMY WITH NEEDLE LOCALIZATION Left 11/14/2017   Procedure: BREAST LUMPECTOMY WITH NEEDLE LOCALIZATION;  Surgeon: Benjamine Sprague, DO;  Location: [facility] ORS;  Service: General;  Laterality: Left;  . COLONOSCOPY WITH PROPOFOL    . SENTINEL NODE BIOPSY Left 11/14/2017   Procedure: SENTINEL NODE BIOPSY;  Surgeon: Lysle Pearl,  Isami, DO;  Location: [facility] ORS;  Service: General;  Laterality: Left;    Social History   Socioeconomic History  . Marital status: Married    Spouse name: Not on file  . Number of children: Not on file  . Years of education: Not on file  . Highest  education level: Not on file  Occupational History  . Not on file  Social Needs  . Financial resource strain: Not on file  . Food insecurity:    Worry: Not on file    Inability: Not on file  . Transportation needs:    Medical: Not on file    Non-medical: Not on file  Tobacco Use  . Smoking status: Former Smoker    Packs/day: 0.25    Last attempt to quit: 06/08/1970    Years since quitting: 47.6  . Smokeless tobacco: Never Used  Substance and Sexual Activity  . Alcohol use: Yes    Alcohol/week: 3.0 standard drinks    Types: 3 Glasses of wine per week    Comment: 1 glass of wine daily.  . Drug use: No  . Sexual activity: Yes  Lifestyle  . Physical activity:    Days per week: Not on file    Minutes per session: Not on file  . Stress: Not on file  Relationships  . Social connections:    Talks on phone: Not on file    Gets together: Not on file    Attends religious service: Not on file    Active member of club or organization: Not on file    Attends meetings of clubs or organizations: Not on file    Relationship status: Not on file  . Intimate partner violence:    Fear of current or ex partner: Not on file    Emotionally abused: Not on file    Physically abused: Not on file    Forced sexual activity: Not on file  Other Topics Concern  . Not on file  Social History Narrative  . Not on file    Family History  Problem Relation Age of Onset  . Breast cancer Cousin   . Breast cancer Cousin   . Breast cancer Paternal Aunt      Current Outpatient Medications:  .  azelastine (ASTELIN) 0.1 % nasal spray, Place 1 spray into both nostrils 2 (two) times daily. Use in each nostril as directed, Disp: , Rfl:  .  bimatoprost (LUMIGAN) 0.01 % SOLN, Place 1 drop into both eyes at bedtime., Disp: , Rfl:  .  Cholecalciferol (VITAMIN D) 2000 units tablet, Take 2,000 Units by mouth daily., Disp: , Rfl:  .  gabapentin (NEURONTIN) 300 MG capsule, Take 600 mg by mouth at bedtime. , Disp:  , Rfl:  .  ibuprofen (ADVIL,MOTRIN) 800 MG tablet, Take 1 tablet (800 mg total) by mouth every 8 (eight) hours as needed for mild pain or moderate pain. (Patient not taking: Reported on 11/21/2017), Disp: 30 tablet, Rfl: 0 .  meloxicam (MOBIC) 7.5 MG tablet, Take 7.5 mg by mouth daily. , Disp: , Rfl:  .  olmesartan-hydrochlorothiazide (BENICAR HCT) 20-12.5 MG tablet, Take 0.5 tablets by mouth daily., Disp: , Rfl:  .  pantoprazole (PROTONIX) 40 MG tablet, Take 40 mg by mouth daily. , Disp: , Rfl:  .  sennosides-docusate sodium (SENOKOT-S) 8.6-50 MG tablet, Take 2 tablets by mouth at bedtime., Disp: , Rfl:  .  timolol (BETIMOL) 0.5 % ophthalmic solution, Place 1 drop into both eyes 2 (two) times  daily., Disp: , Rfl:  .  Turmeric 500 MG CAPS, Take 1 capsule by mouth daily., Disp: , Rfl:  .  vitamin B-12 (CYANOCOBALAMIN) 1000 MCG tablet, Take 1,000 mcg by mouth daily. , Disp: , Rfl:   Physical exam:  Vitals:   01/14/18 1423  BP: (!) 143/79  Pulse: 62  Resp: 18  Temp: (!) 96.1 F (35.6 C)  TempSrc: Tympanic  Weight: 161 lb 6.4 oz (73.2 kg)   Physical Exam HENT:     Head: Normocephalic and atraumatic.  Eyes:     Pupils: Pupils are equal, round, and reactive to light.  Neck:     Musculoskeletal: Normal range of motion.  Cardiovascular:     Rate and Rhythm: Normal rate and regular rhythm.     Heart sounds: Normal heart sounds.  Pulmonary:     Effort: Pulmonary effort is normal.     Breath sounds: Normal breath sounds.  Abdominal:     General: Bowel sounds are normal.     Palpations: Abdomen is soft.  Skin:    General: Skin is warm and dry.  Neurological:     Mental Status: She is alert and oriented to person, place, and time.   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  CMP Latest Ref Rng & Units 06/07/2016  Glucose 65 - 99 mg/dL 91  BUN 6 - 20 mg/dL 26(H)  Creatinine 0.44 - 1.00 mg/dL 1.12(H)  Sodium 135 - 145 mmol/L 135  Potassium 3.5 - 5.1 mmol/L  4.3  Chloride 101 - 111 mmol/L 101  CO2 22 - 32 mmol/L 29  Calcium 8.9 - 10.3 mg/dL 9.7  Total Protein 6.5 - 8.1 g/dL 7.7  Total Bilirubin 0.3 - 1.2 mg/dL 0.6  Alkaline Phos 38 - 126 U/L 67  AST 15 - 41 U/L 23  ALT 14 - 54 U/L 27   CBC Latest Ref Rng & Units 01/06/2018  WBC 4.0 - 10.5 K/uL 3.4(L)  Hemoglobin 12.0 - 15.0 g/dL 10.3(L)  Hematocrit 36.0 - 46.0 % 32.9(L)  Platelets 150 - 400 K/uL 211      Assessment and plan- Patient is a 67 y.o. female with pathologic prognostic stage Ia invasive mammary carcinoma of the left breast grade [id number] ER PR positive HER-2/neu negative status post lumpectomy and adjuvant radiation treatment  Patient has completed adjuvant radiation treatment.Given that her tumor was ER PR positive hormone therapy is indicated at this time.  Idiscussed the role for hormone therapy. Given that she is postmenopausal I would favor 5 years of adjuvant hormone therapy with aromatase inhibitor. I discussed the risks and benefits of Arimidex including all but not limited to fatigue, hypercholesterolemia, hot flashes, arthralgias and worsening bone health.  Patient will also need to be on calcium 1200 mg along with vitamin D 800 international units.  We will obtain a baseline bone density scan written information about Arimidex given to the patient.  Patient verbalized understanding and agrees to proceed.  We will prescribe 1 mg Arimidex starting 03/01/2018 I will see her back in 6 weeks time end of February to see how she is tolerating her treatment.  Treatment will be given with a curative intent  Cancer Staging Malignant neoplasm of upper-outer quadrant of left female breast [facility]) Staging form: Breast, AJCC 8th Edition - Clinical stage from 11/12/2017: cT1b, cN0, cM0, G1, ER: Unknown, PR: Unknown, HER2: Unknown - Signed by Sindy Guadeloupe, MD on 11/12/2017 - Pathologic stage from 01/13/2018: Stage IA (pT1b,  pN0, cM0, G1, ER+, PR+, HER2-) - Signed by Sindy Guadeloupe, MD  on 01/13/2018      Visit Diagnosis 1. Goals of care, counseling/discussion   2. Malignant neoplasm of upper-outer quadrant of left female breast, unspecified estrogen receptor status ([place])      Dr. Randa Evens, MD, MPH [facility] [phone] 01/14/2018 3:24 PM

## 2018-01-14 ENCOUNTER — Inpatient Hospital Stay (HOSPITAL_BASED_OUTPATIENT_CLINIC_OR_DEPARTMENT_OTHER): Payer: Medicare PPO | Admitting: Oncology

## 2018-01-14 ENCOUNTER — Other Ambulatory Visit: Payer: Self-pay

## 2018-01-14 ENCOUNTER — Ambulatory Visit: Payer: Medicare PPO

## 2018-01-14 ENCOUNTER — Other Ambulatory Visit: Payer: Self-pay | Admitting: *Deleted

## 2018-01-14 ENCOUNTER — Encounter: Payer: Self-pay | Admitting: Oncology

## 2018-01-14 ENCOUNTER — Ambulatory Visit
Admission: RE | Admit: 2018-01-14 | Discharge: 2018-01-14 | Disposition: A | Discharge status: Home / Self Care | Payer: Medicare PPO | Source: Ambulatory Visit | Attending: Radiation Oncology | Admitting: Radiation Oncology

## 2018-01-14 VITALS — BP 143/79 | HR 62 | Temp 96.1°F | Resp 18 | Wt 161.4 lb

## 2018-01-14 DIAGNOSIS — Z7189 Other specified counseling: Secondary | ICD-10-CM

## 2018-01-14 DIAGNOSIS — I10 Essential (primary) hypertension: Secondary | ICD-10-CM

## 2018-01-14 DIAGNOSIS — C50412 Malignant neoplasm of upper-outer quadrant of left female breast: Secondary | ICD-10-CM | POA: Diagnosis not present

## 2018-01-14 DIAGNOSIS — Z923 Personal history of irradiation: Secondary | ICD-10-CM

## 2018-01-14 DIAGNOSIS — Z800 Family history of malignant neoplasm of digestive organs: Secondary | ICD-10-CM

## 2018-01-14 DIAGNOSIS — Z87891 Personal history of nicotine dependence: Secondary | ICD-10-CM

## 2018-01-14 DIAGNOSIS — Z170 Estrogen receptor positive status [ER+]: Secondary | ICD-10-CM | POA: Diagnosis not present

## 2018-01-14 DIAGNOSIS — Z780 Asymptomatic menopausal state: Secondary | ICD-10-CM

## 2018-01-14 NOTE — Progress Notes (Signed)
Here for follow up. " im doing fine -feeling pretty good " no pain c/o   Stated from radiation has rash on left breast region- improving she stated.

## 2018-01-15 ENCOUNTER — Ambulatory Visit
Admission: RE | Admit: 2018-01-15 | Discharge: 2018-01-15 | Disposition: A | Discharge status: Home / Self Care | Payer: Medicare PPO | Source: Ambulatory Visit | Attending: Radiation Oncology | Admitting: Radiation Oncology

## 2018-01-15 DIAGNOSIS — C50412 Malignant neoplasm of upper-outer quadrant of left female breast: Secondary | ICD-10-CM | POA: Diagnosis not present

## 2018-01-30 ENCOUNTER — Other Ambulatory Visit: Payer: Self-pay | Admitting: *Deleted

## 2018-01-30 MED ORDER — ANASTROZOLE 1 MG PO TABS: 1.0000 mg | ORAL_TABLET | Freq: Every day | ORAL | 3 refills | Status: DC

## 2018-01-30 NOTE — Progress Notes (Signed)
Patient called and reminded me about sending her Arimidex to [facility] mail in. Dr. Janese Banks had seen her in dec. But she was in her donut hole and she could not afford med. Wanted to wait to new year and new benefits and I sent it in today after speaking to her about what pharmacy to use through [place] and it was [facility] [place]. Rx sent electronically

## 2018-02-19 ENCOUNTER — Other Ambulatory Visit: Payer: Self-pay

## 2018-02-19 ENCOUNTER — Ambulatory Visit
Admission: RE | Admit: 2018-02-19 | Discharge: 2018-02-19 | Disposition: A | Discharge status: Home / Self Care | Payer: Medicare PPO | Source: Ambulatory Visit | Attending: Radiation Oncology | Admitting: Radiation Oncology

## 2018-02-19 ENCOUNTER — Encounter: Payer: Self-pay | Admitting: Radiation Oncology

## 2018-02-19 VITALS — BP 146/80 | HR 57 | Temp 95.1°F | Resp 18 | Wt 162.8 lb

## 2018-02-19 DIAGNOSIS — Z170 Estrogen receptor positive status [ER+]: Secondary | ICD-10-CM | POA: Diagnosis not present

## 2018-02-19 DIAGNOSIS — C50412 Malignant neoplasm of upper-outer quadrant of left female breast: Secondary | ICD-10-CM | POA: Insufficient documentation

## 2018-02-19 DIAGNOSIS — Z923 Personal history of irradiation: Secondary | ICD-10-CM | POA: Insufficient documentation

## 2018-02-19 DIAGNOSIS — Z79811 Long term (current) use of aromatase inhibitors: Secondary | ICD-10-CM | POA: Insufficient documentation

## 2018-02-19 NOTE — Progress Notes (Signed)
Radiation Oncology Follow up Note  Name: Megan Briggs   Date:   02/19/2018 MRN:  [id number] DOB: 03/11/1950    This 68 y.o. female presents to the clinic today for one-month follow-up status post whole breast radiation to her left breast for stage I invasive mammary carcinoma.  REFERRING PROVIDER: Rusty Aus, MD  HPI: patient is a 68 year old female now out 1 month having completed whole breast radiation to her left breast for stage I invasive mammary carcinoma..she is currently on arimadex time that well without side effect. She specifically denies breast tenderness cough or bone pain.  COMPLICATIONS OF TREATMENT: none  FOLLOW UP COMPLIANCE: keeps appointments   PHYSICAL EXAM:  BP (!) 146/80 (BP Location: Left Arm, Patient Position: Sitting)   Pulse (!) 57   Temp (!) 95.1 F (35.1 C) (Tympanic)   Resp 18   Wt 162 lb 13 oz (73.9 kg)   BMI 26.28 kg/m  Lungs are clear to A&P cardiac examination essentially unremarkable with regular rate and rhythm. No dominant mass or nodularity is noted in either breast in 2 positions examined. Incision is well-healed. No axillary or supraclavicular adenopathy is appreciated. Cosmetic result is excellent.Well-developed well-nourished patient in NAD. HEENT reveals PERLA, EOMI, discs not visualized.  Oral cavity is clear. No oral mucosal lesions are identified. Neck is clear without evidence of cervical or supraclavicular adenopathy. Lungs are clear to A&P. Cardiac examination is essentially unremarkable with regular rate and rhythm without murmur rub or thrill. Abdomen is benign with no organomegaly or masses noted. Motor sensory and DTR levels are equal and symmetric in the upper and lower extremities. Cranial nerves II through XII are grossly intact. Proprioception is intact. No peripheral adenopathy or edema is identified. No motor or sensory levels are noted. Crude visual fields are within normal range.  RADIOLOGY RESULTS: no current films for  review  PLAN: resent time patient is doing well 1 month out from whole breast radiation. I am please were overall progress. I've asked to see her back in 4-5 months for follow-up. She continues close follow-up care with medical oncology. Patient is to call with any concerns at any time.  I would like to take this opportunity to thank you for allowing me to participate in the care of your patient.Noreene Filbert, MD

## 2018-03-05 ENCOUNTER — Ambulatory Visit
Admission: RE | Admit: 2018-03-05 | Discharge: 2018-03-05 | Disposition: A | Discharge status: Home / Self Care | Payer: Medicare PPO | Source: Ambulatory Visit | Attending: Oncology | Admitting: Oncology

## 2018-03-05 DIAGNOSIS — M85852 Other specified disorders of bone density and structure, left thigh: Secondary | ICD-10-CM | POA: Insufficient documentation

## 2018-03-05 DIAGNOSIS — C50412 Malignant neoplasm of upper-outer quadrant of left female breast: Secondary | ICD-10-CM | POA: Diagnosis not present

## 2018-03-10 ENCOUNTER — Inpatient Hospital Stay: Payer: Medicare PPO | Attending: Oncology

## 2018-03-10 ENCOUNTER — Encounter: Payer: Self-pay | Admitting: Oncology

## 2018-03-10 ENCOUNTER — Inpatient Hospital Stay (HOSPITAL_BASED_OUTPATIENT_CLINIC_OR_DEPARTMENT_OTHER): Payer: Medicare PPO | Admitting: Oncology

## 2018-03-10 ENCOUNTER — Other Ambulatory Visit: Payer: Self-pay

## 2018-03-10 VITALS — BP 129/73 | HR 67 | Temp 96.7°F | Resp 17 | Wt 161.4 lb

## 2018-03-10 DIAGNOSIS — Z791 Long term (current) use of non-steroidal anti-inflammatories (NSAID): Secondary | ICD-10-CM | POA: Diagnosis not present

## 2018-03-10 DIAGNOSIS — Z170 Estrogen receptor positive status [ER+]: Secondary | ICD-10-CM | POA: Diagnosis not present

## 2018-03-10 DIAGNOSIS — Z79899 Other long term (current) drug therapy: Secondary | ICD-10-CM | POA: Diagnosis not present

## 2018-03-10 DIAGNOSIS — Z87891 Personal history of nicotine dependence: Secondary | ICD-10-CM

## 2018-03-10 DIAGNOSIS — Z79811 Long term (current) use of aromatase inhibitors: Secondary | ICD-10-CM | POA: Diagnosis not present

## 2018-03-10 DIAGNOSIS — C50412 Malignant neoplasm of upper-outer quadrant of left female breast: Secondary | ICD-10-CM

## 2018-03-10 DIAGNOSIS — Z5181 Encounter for therapeutic drug level monitoring: Secondary | ICD-10-CM

## 2018-03-10 DIAGNOSIS — I10 Essential (primary) hypertension: Secondary | ICD-10-CM | POA: Insufficient documentation

## 2018-03-10 DIAGNOSIS — Z923 Personal history of irradiation: Secondary | ICD-10-CM

## 2018-03-10 DIAGNOSIS — D649 Anemia, unspecified: Secondary | ICD-10-CM | POA: Insufficient documentation

## 2018-03-10 DIAGNOSIS — M8580 Other specified disorders of bone density and structure, unspecified site: Secondary | ICD-10-CM | POA: Diagnosis not present

## 2018-03-10 DIAGNOSIS — Z853 Personal history of malignant neoplasm of breast: Secondary | ICD-10-CM

## 2018-03-10 DIAGNOSIS — Z08 Encounter for follow-up examination after completed treatment for malignant neoplasm: Secondary | ICD-10-CM

## 2018-03-10 LAB — COMPREHENSIVE METABOLIC PANEL
ALK PHOS: 67 U/L (ref 38–126)
ALT: 24 U/L (ref 0–44)
AST: 26 U/L (ref 15–41)
Albumin: 4.2 g/dL (ref 3.5–5.0)
Anion gap: 7 (ref 5–15)
BILIRUBIN TOTAL: 0.6 mg/dL (ref 0.3–1.2)
BUN: 30 mg/dL — ABNORMAL HIGH (ref 8–23)
CALCIUM: 9.2 mg/dL (ref 8.9–10.3)
CO2: 27 mmol/L (ref 22–32)
Chloride: 104 mmol/L (ref 98–111)
Creatinine, Ser: 1.3 mg/dL — ABNORMAL HIGH (ref 0.44–1.00)
GFR, EST AFRICAN AMERICAN: 48 mL/min — AB (ref >60)
GFR, EST NON AFRICAN AMERICAN: 42 mL/min — AB (ref >60)
Glucose, Bld: 116 mg/dL — ABNORMAL HIGH (ref 70–99)
Potassium: 4.2 mmol/L (ref 3.5–5.1)
Sodium: 138 mmol/L (ref 135–145)
TOTAL PROTEIN: 7.3 g/dL (ref 6.5–8.1)

## 2018-03-10 NOTE — Progress Notes (Signed)
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 She is now been referred to me for new diagnosis of breast cancer. Patient had a screening mammogram on 10/16/2017 which showed suspicious mass in the upper outer quadrant of the left breast 6 x 5 x 4 mm in size which was also confirmed on ultrasound. No evidence of left axillary adenopathy. Patient underwent biopsy of the breast mass which showed grade 1, 1 mm invasive mammary carcinoma. ER PR status was not obtained due to the small size of the breast biopsy.  Patient underwent lumpectomy and sentinel lymph node biopsy on 11/14/2017.  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  One sentinel lymph node was negative for malignancy.  pT1b pN0.  Given that her tumor was less than 1 cm and grade 1 she did not require any Oncotype testing and adjuvant chemotherapy.  Patient seen by radiation oncology and completes adjuvant radiation tomorrow  Patient was started on Arimidex in December 2019.  Baseline bone density scan showed osteopenia with a score of -1.5.  10-year probability of a major osteoporotic fracture was 15% and hip fracture was 2.5%  Interval history-she reports tolerating Arimidex well except for mild self-limited hot flashes.  Denies any arthralgias.  Reports some self-limited constipation as  well.  ECOG PS- 0 Pain scale- 0   Review of systems- Review of Systems  Constitutional: Negative for chills, fever, malaise/fatigue and weight loss.  HENT: Negative for congestion, ear discharge and nosebleeds.   Eyes: Negative for blurred vision.  Respiratory: Negative for cough, hemoptysis, sputum production, shortness of breath and wheezing.   Cardiovascular: Negative for chest pain, palpitations, orthopnea and claudication.  Gastrointestinal: Positive for constipation. Negative for abdominal pain, blood in stool, diarrhea, heartburn, melena, nausea and vomiting.  Genitourinary: Negative for dysuria, flank pain, frequency, hematuria and urgency.  Musculoskeletal: Negative for back pain, joint pain and myalgias.  Skin: Negative for rash.  Neurological: Negative for dizziness, tingling, focal weakness, seizures, weakness and headaches.  Endo/Heme/Allergies: Does not bruise/bleed easily.  Psychiatric/Behavioral: Negative for depression and suicidal ideas. The patient does not have insomnia.        Allergies  Allergen Reactions  . Ace Inhibitors Anaphylaxis and Swelling    Lisinopril   . Tape Other (See Comments)    Band-aids caused redness and irritation Band-aids caused redness and irritation     Past Medical History:  Diagnosis Date  . Anemia   . Cancer ([place])    Left breast  . Glaucoma   . Hypertension   . Neuropathy      Past Surgical History:  Procedure Laterality Date  . BACK SURGERY    . BREAST BIOPSY Right 1995   benign  . BREAST BIOPSY Left 11/01/2017   US guided biopsy - ribbon clip  . BREAST LUMPECTOMY Left 11/14/2017  . BREAST LUMPECTOMY WITH NEEDLE LOCALIZATION Left 11/14/2017   Procedure: BREAST LUMPECTOMY  WITH NEEDLE LOCALIZATION;  Surgeon: Benjamine Sprague, DO;  Location: [facility] ORS;  Service: General;  Laterality: Left;  . COLONOSCOPY WITH PROPOFOL    . SENTINEL NODE BIOPSY Left 11/14/2017   Procedure: SENTINEL NODE BIOPSY;  Surgeon: Benjamine Sprague, DO;   Location: [facility] ORS;  Service: General;  Laterality: Left;    Social History   Socioeconomic History  . Marital status: Married    Spouse name: Not on file  . Number of children: Not on file  . Years of education: Not on file  . Highest education level: Not on file  Occupational History  . Not on file  Social Needs  . Financial resource strain: Not on file  . Food insecurity:    Worry: Not on file    Inability: Not on file  . Transportation needs:    Medical: Not on file    Non-medical: Not on file  Tobacco Use  . Smoking status: Former Smoker    Packs/day: 0.25    Last attempt to quit: 06/08/1970    Years since quitting: 47.7  . Smokeless tobacco: Never Used  Substance and Sexual Activity  . Alcohol use: Yes    Alcohol/week: 3.0 standard drinks    Types: 3 Glasses of wine per week    Comment: 1 glass of wine daily.  . Drug use: No  . Sexual activity: Yes  Lifestyle  . Physical activity:    Days per week: Not on file    Minutes per session: Not on file  . Stress: Not on file  Relationships  . Social connections:    Talks on phone: Not on file    Gets together: Not on file    Attends religious service: Not on file    Active member of club or organization: Not on file    Attends meetings of clubs or organizations: Not on file    Relationship status: Not on file  . Intimate partner violence:    Fear of current or ex partner: Not on file    Emotionally abused: Not on file    Physically abused: Not on file    Forced sexual activity: Not on file  Other Topics Concern  . Not on file  Social History Narrative  . Not on file    Family History  Problem Relation Age of Onset  . Breast cancer Cousin   . Breast cancer Cousin   . Breast cancer Paternal Aunt      Current Outpatient Medications:  .  anastrozole (ARIMIDEX) 1 MG tablet, Take 1 tablet (1 mg total) by mouth daily., Disp: 30 tablet, Rfl: 3 .  azelastine (ASTELIN) 0.1 % nasal spray, Place 1 spray into  both nostrils 2 (two) times daily. Use in each nostril as directed, Disp: , Rfl:  .  bimatoprost (LUMIGAN) 0.01 % SOLN, Place 1 drop into the right eye at bedtime. , Disp: , Rfl:  .  Calcium Carb-Cholecalciferol (CALCIUM-VITAMIN D3) 600-500 MG-UNIT CAPS, Take 2 tablets by mouth., Disp: , Rfl:  .  Cholecalciferol (VITAMIN D) 2000 units tablet, Take 2,000 Units by mouth daily., Disp: , Rfl:  .  gabapentin (NEURONTIN) 300 MG capsule, Take 600 mg by mouth at bedtime. , Disp: , Rfl:  .  ibuprofen (ADVIL,MOTRIN) 800 MG tablet, Take 1 tablet (800 mg total) by mouth every 8 (eight) hours as needed for mild pain or moderate pain., Disp: 30 tablet, Rfl: 0 .  meloxicam (MOBIC) 7.5 MG tablet, Take 7.5 mg by mouth daily. ,  Disp: , Rfl:  .  olmesartan-hydrochlorothiazide (BENICAR HCT) 20-12.5 MG tablet, Take 0.5 tablets by mouth daily., Disp: , Rfl:  .  pantoprazole (PROTONIX) 40 MG tablet, Take 40 mg by mouth daily. , Disp: , Rfl:  .  sennosides-docusate sodium (SENOKOT-S) 8.6-50 MG tablet, Take 2 tablets by mouth at bedtime., Disp: , Rfl:  .  timolol (BETIMOL) 0.5 % ophthalmic solution, Place 1 drop into both eyes 2 (two) times daily., Disp: , Rfl:  .  Turmeric 500 MG CAPS, Take 1 capsule by mouth daily., Disp: , Rfl:  .  vitamin B-12 (CYANOCOBALAMIN) 1000 MCG tablet, Take 1,000 mcg by mouth daily. , Disp: , Rfl:  .  Netarsudil-Latanoprost (ROCKLATAN) 0.02-0.005 % SOLN, Apply to eye every morning., Disp: , Rfl:   Physical exam:  Vitals:   03/10/18 1125  BP: 129/73  Pulse: 67  Resp: 17  Temp: (!) 96.7 F (35.9 C)  TempSrc: Tympanic  Weight: 161 lb 6.4 oz (73.2 kg)   Physical Exam Constitutional:      General: She is not in acute distress. HENT:     Head: Normocephalic and atraumatic.  Eyes:     Pupils: Pupils are equal, round, and reactive to light.  Neck:     Musculoskeletal: Normal range of motion.  Cardiovascular:     Rate and Rhythm: Normal rate and regular rhythm.     Heart sounds:  Normal heart sounds.  Pulmonary:     Effort: Pulmonary effort is normal.     Breath sounds: Normal breath sounds.  Abdominal:     General: Bowel sounds are normal.     Palpations: Abdomen is soft.  Skin:    General: Skin is warm and dry.  Neurological:     Mental Status: She is alert and oriented to person, place, and time.      CMP Latest Ref Rng & Units 03/10/2018  Glucose 70 - 99 mg/dL 116(H)  BUN 8 - 23 mg/dL 30(H)  Creatinine 0.44 - 1.00 mg/dL 1.30(H)  Sodium 135 - 145 mmol/L 138  Potassium 3.5 - 5.1 mmol/L 4.2  Chloride 98 - 111 mmol/L 104  CO2 22 - 32 mmol/L 27  Calcium 8.9 - 10.3 mg/dL 9.2  Total Protein 6.5 - 8.1 g/dL 7.3  Total Bilirubin 0.3 - 1.2 mg/dL 0.6  Alkaline Phos 38 - 126 U/L 67  AST 15 - 41 U/L 26  ALT 0 - 44 U/L 24   CBC Latest Ref Rng & Units 01/06/2018  WBC 4.0 - 10.5 K/uL 3.4(L)  Hemoglobin 12.0 - 15.0 g/dL 10.3(L)  Hematocrit 36.0 - 46.0 % 32.9(L)  Platelets 150 - 400 K/uL 211    No images are attached to the encounter.  Dg Bone Density  Result Date: 03/05/2018 EXAM: DUAL X-RAY ABSORPTIOMETRY (DXA) FOR BONE MINERAL DENSITY IMPRESSION: Technologist: MTB Your patient Matricia Begnaud completed a BMD test on 03/05/2018 using the [place] (analysis version: 14.10) manufactured by [employer]. The following summarizes the results of our evaluation. PATIENT BIOGRAPHICAL: Name: Carmen, Tolliver Patient ID: [id number] Birth Date: 26-Jul-1950 Height: 65.0 in. Gender: Female Exam Date: 03/05/2018 Weight: 155.0 lbs. Indications: Breast CA, Parent Hip Fracture, Postmenopausal, Family Hist. (Parent hip fracture) Fractures: Treatments: Arimidex, calcium w/ vit D ASSESSMENT: The BMD measured at Femur Neck Left is 0.835 g/cm2 with a T-score of -1.5. This patient is considered osteopenic according to [place] [facility]) criteria. The quality of the scan is good. L-2 and L-3 were excluded due to  degenerative changes. Site Region Measured Measured WHO  Young Adult BMD Date       Age      Classification T-score AP Spine L1-L4 (L2,L3) 1/05/2018 69.5 Normal 0.4 1.233 g/cm2 DualFemur Neck Left 03/05/2018 69.5 Osteopenia -1.5 0.835 g/cm2 DualFemur Total Mean 03/05/2018 69.5 Normal -0.9 0.891 g/cm2 World Health Organization [facility]) criteria for post-menopausal, Caucasian Women: Normal:       T-score at or above -1 SD Osteopenia:   T-score between -1 and -2.5 SD Osteoporosis: T-score at or below -2.5 SD RECOMMENDATIONS: 1. All patients should optimize calcium and vitamin D intake. 2. Consider FDA-approved medical therapies in postmenopausal women and men aged 68 years and older, based on the following: a. A hip or vertebral(clinical or morphometric) fracture b. T-score < -2.5 at the femoral neck or spine after appropriate evaluation to exclude secondary causes c. Low bone mass (T-score between -1.0 and -2.5 at the femoral neck or spine) and a 10-year probability of a hip fracture > 3% or a 10-year probability of a major osteoporosis-related fracture > 20% based on the [place]-adapted WHO algorithm d. Clinician judgment and/or patient preferences may indicate treatment for people with 10-year fracture probabilities above or below these levels FOLLOW-UP: People with diagnosed cases of osteoporosis or at high risk for fracture should have regular bone mineral density tests. For patients eligible for Medicare, routine testing is allowed once every 2 years. The testing frequency can be increased to one year for patients who have rapidly progressing disease, those who are receiving or discontinuing medical therapy to restore bone mass, or have additional risk factors. I have reviewed this report, and agree with the above findings. [facility] Radiology Dear Weston Anna Aleni Andrus, Your patient MIRA BALON completed a FRAX assessment on 03/05/2018 using the [place] (analysis version: 14.10) manufactured by [employer]. The following summarizes the results of our evaluation.  PATIENT BIOGRAPHICAL: Name: Shianna, Bally Patient ID: [id number] Birth Date: 1951-01-26 Height:    65.0 in. Gender:     Female    [age]       Weight:    155.0 lbs. Ethnicity:  White                            Exam Date: 03/05/2018 FRAX* RESULTS:  (version: 3.5) 10-year Probability of Fracture1 Major Osteoporotic Fracture2 Hip Fracture 15.8% 2.7% Population: [place] (Caucasian) Risk Factors: Family Hist. (Parent hip fracture) Based on Femur (Left) Neck BMD 1 -The 10-year probability of fracture may be lower than reported if the patient has received treatment. 2 -Major Osteoporotic Fracture: Clinical Spine, Forearm, Hip or Shoulder *FRAX is a Materials engineer of the [employer] for Metabolic Bone Disease, a [place] (WHO) [facility]. ASSESSMENT: The probability of a major osteoporotic fracture is 15.8% within the next ten years. The probability of a hip fracture is 2.7% within the next ten years. . Electronically Signed   By: Lowella Grip III M.D.   On: 03/05/2018 13:44     Assessment and plan- Patient is a 68 y.o. female with pathologic prognostic stage Ia invasive mammary carcinoma of the left breast grade [id number] ER PR positive HER-2/neu negative status post lumpectomy and adjuvant radiation.  She is here for routine follow-up of breast cancer on Arimidex  Patient is tolerating Arimidex well since she started it 6 weeks ago.  I did discuss the  results of bone density scan which showed osteopenia and reemphasized the importance of taking calcium and vitamin D which she is also compliant with.  Discussed that Dr. Sabra Heck could check her vitamin D levels at next visit and see if she needs to be on a higher dose.  I will see her back in 3 months time.  I will also touch base with Dr. Lysle Pearl to see if she needs a diagnostic mammogram in 6 months and if so I will order the same as well.   Visit Diagnosis 1. Visit for monitoring Arimidex  therapy   2. Encounter for follow-up surveillance of breast cancer      Dr. Randa Evens, MD, MPH [facility] at [facility] [phone] 03/10/2018 1:16 PM

## 2018-03-10 NOTE — Progress Notes (Signed)
Here for follow up. Overall stated " I'm doing good "

## 2018-04-04 ENCOUNTER — Telehealth: Payer: Self-pay | Admitting: *Deleted

## 2018-04-04 NOTE — Telephone Encounter (Signed)
Patient called and wanted me to verify if Dr. Janese Banks had checked with Dr. Lysle Pearl about whether or not the patient needed a mammogram in 6 months from her original diagnosis or not.  Throughout said that she did talk to him and he said no she does not need 1 at 6 months.  I left a message on the patient's voicemail with the information above and she can call if she has questions

## 2018-04-24 ENCOUNTER — Other Ambulatory Visit: Payer: Self-pay | Admitting: Nurse Practitioner

## 2018-06-09 ENCOUNTER — Ambulatory Visit: Payer: Medicare PPO | Admitting: Oncology

## 2018-06-13 ENCOUNTER — Other Ambulatory Visit: Payer: Self-pay | Admitting: *Deleted

## 2018-06-13 DIAGNOSIS — C50412 Malignant neoplasm of upper-outer quadrant of left female breast: Secondary | ICD-10-CM

## 2018-07-22 ENCOUNTER — Telehealth: Payer: Self-pay | Admitting: *Deleted

## 2018-07-22 NOTE — Telephone Encounter (Signed)
Pt called to say that she is having a lot of aches and pains and sometimes has a heaviness on her chest. She wanted to see if she could switch to one of the other meds to see if she can tolerate the other one better. I told her I would check with  rao and let her know. Dr. Janese Banks states to stop the arimidex for 2 weeks and see if symptoms are better and then she can start aromasin. I have left this info on pt's cell phone. I did  Not know if she wants me to send new rx to local pharmacy or does she want to use [facility] mail in. Will await her response

## 2018-07-23 ENCOUNTER — Other Ambulatory Visit: Payer: Self-pay | Admitting: *Deleted

## 2018-07-23 MED ORDER — EXEMESTANE 25 MG PO TABS: 25.0000 mg | ORAL_TABLET | Freq: Every day | ORAL | 1 refills | Status: DC

## 2018-07-23 NOTE — Telephone Encounter (Signed)
Pt called back and wants me to send the rx to [facility]. She also understands to stop the arimidex for 2 weeks before starting the exemestane. rx sent to [place]

## 2018-07-23 NOTE — Progress Notes (Signed)
exemestane

## 2018-07-28 ENCOUNTER — Other Ambulatory Visit: Payer: Self-pay | Admitting: *Deleted

## 2018-07-28 MED ORDER — LETROZOLE 2.5 MG PO TABS: 2.5000 mg | ORAL_TABLET | Freq: Every day | ORAL | 0 refills | Status: DC

## 2018-07-28 NOTE — Telephone Encounter (Signed)
Patient called requesting prescription for Letrozole be sent to [facility], the Exemestane prescription given is not covered by her insurance and she cannot afford it. Letrozole sent to [facility] for 90 day supply will cost her nothing

## 2018-07-29 ENCOUNTER — Other Ambulatory Visit: Payer: Self-pay

## 2018-07-29 NOTE — Addendum Note (Signed)
Addended by: Betti Cruz on: 07/29/2018 08:42 AM   Modules accepted: Orders

## 2018-07-30 ENCOUNTER — Ambulatory Visit
Admission: RE | Admit: 2018-07-30 | Discharge: 2018-07-30 | Disposition: A | Discharge status: Home / Self Care | Payer: Medicare PPO | Source: Ambulatory Visit | Attending: Radiation Oncology | Admitting: Radiation Oncology

## 2018-07-30 ENCOUNTER — Other Ambulatory Visit: Payer: Self-pay

## 2018-07-30 ENCOUNTER — Encounter: Payer: Self-pay | Admitting: Radiation Oncology

## 2018-07-30 VITALS — BP 136/75 | HR 54 | Temp 99.0°F | Wt 159.2 lb

## 2018-07-30 DIAGNOSIS — Z923 Personal history of irradiation: Secondary | ICD-10-CM | POA: Insufficient documentation

## 2018-07-30 DIAGNOSIS — Z170 Estrogen receptor positive status [ER+]: Secondary | ICD-10-CM | POA: Insufficient documentation

## 2018-07-30 DIAGNOSIS — C50412 Malignant neoplasm of upper-outer quadrant of left female breast: Secondary | ICD-10-CM | POA: Diagnosis not present

## 2018-07-30 DIAGNOSIS — Z79811 Long term (current) use of aromatase inhibitors: Secondary | ICD-10-CM | POA: Insufficient documentation

## 2018-07-30 NOTE — Progress Notes (Signed)
Radiation Oncology Follow up Note  Name: Megan Briggs   Date:   07/30/2018 MRN:  [id number] DOB: Mar 29, 1950    This 68 y.o. female presents to the clinic today for 21-month follow-up status post whole breast radiation to her left breast for stage I invasive mammary carcinoma ER PR positive.  REFERRING PROVIDER: Rusty Aus, MD  HPI: Patient is a 68 year old female now about 6 months having completed whole breast radiation to her left breast for stage I ER PR positive invasive mammary carcinoma.  Seen today.  In routine follow-up she is doing well.  She specifically denies breast tenderness cough or bone pain.  She was on arimadex based on side effect profile she is being switched to letrozole.  She is not yet had a follow-up mammogram that is scheduled for September.  COMPLICATIONS OF TREATMENT: none  FOLLOW UP COMPLIANCE: keeps appointments   PHYSICAL EXAM:  BP 136/75   Pulse (!) 54   Temp 99 F (37.2 C)   Wt 159 lb 2.8 oz (72.2 kg)   BMI 25.69 kg/m  Lungs are clear to A&P cardiac examination essentially unremarkable with regular rate and rhythm. No dominant mass or nodularity is noted in either breast in 2 positions examined. Incision is well-healed. No axillary or supraclavicular adenopathy is appreciated. Cosmetic result is excellent.  Well-developed well-nourished patient in NAD. HEENT reveals PERLA, EOMI, discs not visualized.  Oral cavity is clear. No oral mucosal lesions are identified. Neck is clear without evidence of cervical or supraclavicular adenopathy. Lungs are clear to A&P. Cardiac examination is essentially unremarkable with regular rate and rhythm without murmur rub or thrill. Abdomen is benign with no organomegaly or masses noted. Motor sensory and DTR levels are equal and symmetric in the upper and lower extremities. Cranial nerves II through XII are grossly intact. Proprioception is intact. No peripheral adenopathy or edema is identified. No motor or sensory levels  are noted. Crude visual fields are within normal range.  RADIOLOGY RESULTS: Mammograms in September will be reviewed  PLAN: Present time patient is doing well with no evidence of disease.  She is scheduled for mammograms in September which I will review when available.  She will be switching to letrozole and starting that in the near future.  I have asked to see her back in 6 months for follow-up.  Patient knows to call sooner with any concerns.  I would like to take this opportunity to thank you for allowing me to participate in the care of your patient.Noreene Filbert, MD

## 2018-09-19 ENCOUNTER — Other Ambulatory Visit: Payer: Self-pay

## 2018-09-22 ENCOUNTER — Other Ambulatory Visit: Payer: Self-pay

## 2018-09-22 ENCOUNTER — Encounter: Payer: Self-pay | Admitting: Oncology

## 2018-09-22 ENCOUNTER — Inpatient Hospital Stay: Payer: Medicare PPO | Attending: Oncology | Admitting: Oncology

## 2018-09-22 VITALS — BP 148/84 | HR 57 | Temp 97.5°F | Resp 16 | Ht 65.0 in | Wt 160.0 lb

## 2018-09-22 DIAGNOSIS — M8580 Other specified disorders of bone density and structure, unspecified site: Secondary | ICD-10-CM | POA: Insufficient documentation

## 2018-09-22 DIAGNOSIS — Z79811 Long term (current) use of aromatase inhibitors: Secondary | ICD-10-CM | POA: Insufficient documentation

## 2018-09-22 DIAGNOSIS — R5383 Other fatigue: Secondary | ICD-10-CM | POA: Insufficient documentation

## 2018-09-22 DIAGNOSIS — C50912 Malignant neoplasm of unspecified site of left female breast: Secondary | ICD-10-CM | POA: Insufficient documentation

## 2018-09-22 DIAGNOSIS — R232 Flushing: Secondary | ICD-10-CM | POA: Diagnosis not present

## 2018-09-22 DIAGNOSIS — Z08 Encounter for follow-up examination after completed treatment for malignant neoplasm: Secondary | ICD-10-CM

## 2018-09-22 DIAGNOSIS — Z853 Personal history of malignant neoplasm of breast: Secondary | ICD-10-CM

## 2018-09-22 DIAGNOSIS — I10 Essential (primary) hypertension: Secondary | ICD-10-CM | POA: Insufficient documentation

## 2018-09-22 NOTE — Progress Notes (Signed)
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 She is now been referred to me for new diagnosis of breast cancer. Patient had a screening mammogram on 10/16/2017 which showed suspicious mass in the upper outer quadrant of the left breast 6 x 5 x 4 mm in size which was also confirmed on ultrasound. No evidence of left axillary adenopathy. Patient underwent biopsy of the breast mass which showed grade 1, 1 mm invasive mammary carcinoma. ER PR status was not obtained due to the small size of the breast biopsy.  Patient underwent lumpectomy and sentinel lymph node biopsy on 11/14/2017. 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 One sentinel lymph node was negative for malignancy. pT1b pN0.Given that her tumor was less than 1 cm and grade 1 she did not require any Oncotype testing and adjuvant chemotherapy. Patient seen by radiation oncology and completes adjuvant radiation tomorrow  Patient was started on Arimidex in December 2019.  Baseline bone density scan showed osteopenia with a score of -1.5.  10-year probability of a major osteoporotic fracture was 15% and hip fracture was 2.5%.  Patient reported significant body aches and arthralgias with Arimidex and was switched to letrozole   Interval history-patient reports mild fatigue with letrozole.  No  significant joint pains.  She has noticed more thinning of hair as well as hot flashes which mainly come on in the evening.  She reports itchiness over the left nipple.  ECOG PS- 1 Pain scale- 0 Opioid associated constipation- no  Review of systems- Review of Systems  Constitutional: Positive for malaise/fatigue. Negative for chills, fever and weight loss.  HENT: Negative for congestion, ear discharge and nosebleeds.   Eyes: Negative for blurred vision.  Respiratory: Negative for cough, hemoptysis, sputum production, shortness of breath and wheezing.   Cardiovascular: Negative for chest pain, palpitations, orthopnea and claudication.  Gastrointestinal: Negative for abdominal pain, blood in stool, constipation, diarrhea, heartburn, melena, nausea and vomiting.  Genitourinary: Negative for dysuria, flank pain, frequency, hematuria and urgency.  Musculoskeletal: Negative for back pain, joint pain and myalgias.  Skin: Negative for rash.       Itchiness over left nipple  Neurological: Negative for dizziness, tingling, focal weakness, seizures, weakness and headaches.  Endo/Heme/Allergies: Does not bruise/bleed easily.       Hot flashes  Psychiatric/Behavioral: Negative for depression and suicidal ideas. The patient does not have insomnia.       Allergies  Allergen Reactions  . Ace Inhibitors Anaphylaxis and Swelling    Lisinopril   . Tape Other (See Comments)    Band-aids caused redness and irritation Band-aids caused redness and irritation     Past Medical History:  Diagnosis Date  . Anemia   . Cancer ([place])    Left breast  . Glaucoma   . Hypertension   . Neuropathy      Past Surgical History:  Procedure Laterality Date  . BACK SURGERY    .  BREAST BIOPSY Right 1995   benign  . BREAST BIOPSY Left 11/01/2017   US guided biopsy - ribbon clip  . BREAST LUMPECTOMY Left 11/14/2017  . BREAST LUMPECTOMY WITH NEEDLE LOCALIZATION Left 11/14/2017   Procedure: BREAST LUMPECTOMY  WITH NEEDLE LOCALIZATION;  Surgeon: Benjamine Sprague, DO;  Location: [facility] ORS;  Service: General;  Laterality: Left;  . COLONOSCOPY WITH PROPOFOL    . SENTINEL NODE BIOPSY Left 11/14/2017   Procedure: SENTINEL NODE BIOPSY;  Surgeon: Benjamine Sprague, DO;  Location: [facility] ORS;  Service: General;  Laterality: Left;    Social History   Socioeconomic History  . Marital status: Married    Spouse name: Not on file  . Number of children: Not on file  . Years of education: Not on file  . Highest education level: Not on file  Occupational History  . Not on file  Social Needs  . Financial resource strain: Not on file  . Food insecurity    Worry: Not on file    Inability: Not on file  . Transportation needs    Medical: Not on file    Non-medical: Not on file  Tobacco Use  . Smoking status: Former Smoker    Packs/day: 0.25    Quit date: 06/08/1970    Years since quitting: 48.3  . Smokeless tobacco: Never Used  Substance and Sexual Activity  . Alcohol use: Yes    Alcohol/week: 3.0 standard drinks    Types: 3 Glasses of wine per week    Comment: 1 glass of wine daily.  . Drug use: No  . Sexual activity: Yes  Lifestyle  . Physical activity    Days per week: Not on file    Minutes per session: Not on file  . Stress: Not on file  Relationships  . Social Herbalist on phone: Not on file    Gets together: Not on file    Attends religious service: Not on file    Active member of club or organization: Not on file    Attends meetings of clubs or organizations: Not on file    Relationship status: Not on file  . Intimate partner violence    Fear of current or ex partner: Not on file    Emotionally abused: Not on file    Physically abused: Not on file    Forced sexual activity: Not on file  Other Topics Concern  . Not on file  Social History Narrative  . Not on file    Family History  Problem Relation Age of Onset  . Breast cancer Cousin   . Breast cancer Cousin   . Breast  cancer Paternal Aunt      Current Outpatient Medications:  .  azelastine (ASTELIN) 0.1 % nasal spray, Place 1 spray into both nostrils 2 (two) times daily. Use in each nostril as directed, Disp: , Rfl:  .  bimatoprost (LUMIGAN) 0.01 % SOLN, Place 1 drop into the right eye at bedtime. , Disp: , Rfl:  .  Calcium Carb-Cholecalciferol (CALCIUM-VITAMIN D3) 600-500 MG-UNIT CAPS, Take 2 tablets by mouth., Disp: , Rfl:  .  Cholecalciferol (VITAMIN D) 2000 units tablet, Take 2,000 Units by mouth daily., Disp: , Rfl:  .  gabapentin (NEURONTIN) 300 MG capsule, Take 600 mg by mouth at bedtime. , Disp: , Rfl:  .  ibuprofen (ADVIL,MOTRIN) 800 MG tablet, Take 1 tablet (800 mg total) by mouth every 8 (eight) hours as needed for mild pain or moderate pain., Disp:  30 tablet, Rfl: 0 .  letrozole (FEMARA) 2.5 MG tablet, Take 1 tablet (2.5 mg total) by mouth daily., Disp: 90 tablet, Rfl: 0 .  meloxicam (MOBIC) 7.5 MG tablet, Take 7.5 mg by mouth daily. , Disp: , Rfl:  .  Netarsudil-Latanoprost (ROCKLATAN) 0.02-0.005 % SOLN, Apply to eye every morning., Disp: , Rfl:  .  olmesartan-hydrochlorothiazide (BENICAR HCT) 20-12.5 MG tablet, Take 0.5 tablets by mouth daily., Disp: , Rfl:  .  pantoprazole (PROTONIX) 40 MG tablet, Take 40 mg by mouth daily. , Disp: , Rfl:  .  sennosides-docusate sodium (SENOKOT-S) 8.6-50 MG tablet, Take 2 tablets by mouth at bedtime., Disp: , Rfl:  .  timolol (BETIMOL) 0.5 % ophthalmic solution, Place 1 drop into both eyes 2 (two) times daily., Disp: , Rfl:  .  Turmeric 500 MG CAPS, Take 1 capsule by mouth daily., Disp: , Rfl:  .  vitamin B-12 (CYANOCOBALAMIN) 1000 MCG tablet, Take 1,000 mcg by mouth daily. , Disp: , Rfl:   Physical exam: There were no vitals filed for this visit. Physical Exam Constitutional:      General: She is not in acute distress. HENT:     Head: Normocephalic and atraumatic.  Eyes:     Pupils: Pupils are equal, round, and reactive to light.  Neck:      Musculoskeletal: Normal range of motion.  Cardiovascular:     Rate and Rhythm: Normal rate and regular rhythm.     Heart sounds: Normal heart sounds.  Pulmonary:     Effort: Pulmonary effort is normal.     Breath sounds: Normal breath sounds.  Abdominal:     General: Bowel sounds are normal.     Palpations: Abdomen is soft.  Skin:    General: Skin is warm and dry.  Neurological:     Mental Status: She is alert and oriented to person, place, and time.    Breast exam was performed in seated and lying down position. Patient is status post left lumpectomy with a well-healed surgical scar. No evidence of any palpable masses. No evidence of axillary adenopathy. No evidence of any palpable masses or lumps in the right breast. No evidence of right axillary adenopathy.  She has a minute crusted spot over the left nipple which appears like seborrheic keratosis   CMP Latest Ref Rng & Units 03/10/2018  Glucose 70 - 99 mg/dL 116(H)  BUN 8 - 23 mg/dL 30(H)  Creatinine 0.44 - 1.00 mg/dL 1.30(H)  Sodium 135 - 145 mmol/L 138  Potassium 3.5 - 5.1 mmol/L 4.2  Chloride 98 - 111 mmol/L 104  CO2 22 - 32 mmol/L 27  Calcium 8.9 - 10.3 mg/dL 9.2  Total Protein 6.5 - 8.1 g/dL 7.3  Total Bilirubin 0.3 - 1.2 mg/dL 0.6  Alkaline Phos 38 - 126 U/L 67  AST 15 - 41 U/L 26  ALT 0 - 44 U/L 24   CBC Latest Ref Rng & Units 01/06/2018  WBC 4.0 - 10.5 K/uL 3.4(L)  Hemoglobin 12.0 - 15.0 g/dL 10.3(L)  Hematocrit 36.0 - 46.0 % 32.9(L)  Platelets 150 - 400 K/uL 211    Assessment and plan- Patient is a 68 y.o. female with pathologic prognostic stage Ia invasive mammary carcinoma of the left breast grade [id number] ER PR positive HER-2/neu negative status post lumpectomy and adjuvant radiation.  She is here for routine follow-up of breast cancer on letrozole  Patient is not sure if she really wants to continue endocrine therapy for 5 years.  Discussed  that although she is 68 years of age she has a good performance  status and given that her tumor was strongly ER PR positive there would be value in continuing hormone therapy for 5 years.  I have also given her written information about tamoxifen.  Patient could not tolerate Arimidex and could not afford Aromasin.  She is currently on letrozole but mild symptoms but overall she does not like the way letrozole makes her feel.  She will call us if she would like to switch to tamoxifen but is willing to continue letrozole at this time.  No palpable masses on breast exam today.  Minute crusted lesion over the left nipple appears to be consistent with seborrheic keratosis and I have asked the patient to talk to her dermatologist about this.  She has an upcoming mammogram on 10/13/2018.  I will see her back in 6 months no labs   Visit Diagnosis 1. Encounter for follow-up surveillance of breast cancer   2. Use of letrozole (Femara)      Dr. Randa Evens, MD, MPH [facility] [phone] 09/22/2018 9:59 AM

## 2018-09-22 NOTE — Progress Notes (Signed)
Having hot flashes.  Complaint of left nipple pruritis for aoabut 6 months.  States crustiness and breast pain.

## 2018-10-10 ENCOUNTER — Emergency Department
Admission: EM | Admit: 2018-10-10 | Discharge: 2018-10-10 | Disposition: A | Discharge status: Home / Self Care | Payer: Medicare PPO | Attending: Student in an Organized Health Care Education/Training Program | Admitting: Student in an Organized Health Care Education/Training Program

## 2018-10-10 ENCOUNTER — Emergency Department: Payer: Medicare PPO

## 2018-10-10 ENCOUNTER — Other Ambulatory Visit: Payer: Self-pay

## 2018-10-10 DIAGNOSIS — Z79899 Other long term (current) drug therapy: Secondary | ICD-10-CM | POA: Insufficient documentation

## 2018-10-10 DIAGNOSIS — Z87891 Personal history of nicotine dependence: Secondary | ICD-10-CM | POA: Insufficient documentation

## 2018-10-10 DIAGNOSIS — Y999 Unspecified external cause status: Secondary | ICD-10-CM | POA: Diagnosis not present

## 2018-10-10 DIAGNOSIS — I10 Essential (primary) hypertension: Secondary | ICD-10-CM | POA: Diagnosis not present

## 2018-10-10 DIAGNOSIS — Y939 Activity, unspecified: Secondary | ICD-10-CM | POA: Insufficient documentation

## 2018-10-10 DIAGNOSIS — S43015A Anterior dislocation of left humerus, initial encounter: Secondary | ICD-10-CM | POA: Diagnosis not present

## 2018-10-10 DIAGNOSIS — S4992XA Unspecified injury of left shoulder and upper arm, initial encounter: Secondary | ICD-10-CM | POA: Diagnosis present

## 2018-10-10 DIAGNOSIS — S43005A Unspecified dislocation of left shoulder joint, initial encounter: Secondary | ICD-10-CM

## 2018-10-10 DIAGNOSIS — W010XXA Fall on same level from slipping, tripping and stumbling without subsequent striking against object, initial encounter: Secondary | ICD-10-CM | POA: Diagnosis not present

## 2018-10-10 DIAGNOSIS — Y929 Unspecified place or not applicable: Secondary | ICD-10-CM | POA: Diagnosis not present

## 2018-10-10 MED ORDER — HYDROCODONE-ACETAMINOPHEN 5-325 MG PO TABS: 1.0000 | ORAL_TABLET | ORAL | 0 refills | Status: DC | PRN

## 2018-10-10 MED ORDER — POLYETHYLENE GLYCOL 3350 17 G PO PACK: 17.0000 g | PACK | Freq: Every day | ORAL | 0 refills | Status: DC

## 2018-10-10 MED ORDER — HYDROCODONE-ACETAMINOPHEN 5-325 MG PO TABS
1.0000 | ORAL_TABLET | Freq: Once | ORAL | Status: AC
  Administered 2018-10-10: 23:00:00 1 via ORAL
  Filled 2018-10-10: qty 1

## 2018-10-10 MED ORDER — FENTANYL CITRATE (PF) 100 MCG/2ML IJ SOLN
50.0000 ug | INTRAMUSCULAR | Status: DC | PRN
  Administered 2018-10-10: 50 ug via INTRAVENOUS
  Filled 2018-10-10: qty 2

## 2018-10-10 NOTE — ED Provider Notes (Signed)
[facility] Emergency Department Provider Note    First MD Initiated Contact with Patient 10/10/18 2137     (approximate)  I have reviewed the triage vital signs and the nursing notes.   HISTORY  Chief Complaint Fall and Shoulder Pain    HPI Megan Briggs is a 68 y.o. female the plosive past medical history presents the ER for left shoulder pain that occurs after mechanical fall.  Tripped over some loose fitting sandals she is on her driveway.  Did not hit her head.  Brought in for fall occurred onto her left shoulder.  She is left-hand dominant.  Reports moderate to severe pain in left shoulder.  Is not any blood thinners.  Denies any neck pain abdominal pain chest pain hip pain or leg pain.    Past Medical History:  Diagnosis Date  . Anemia   . Cancer ([place])    Left breast  . Glaucoma   . Hypertension   . Neuropathy    Family History  Problem Relation Age of Onset  . Breast cancer Cousin   . Breast cancer Cousin   . Breast cancer Paternal Aunt    Past Surgical History:  Procedure Laterality Date  . BACK SURGERY    . BREAST BIOPSY Right 1995   benign  . BREAST BIOPSY Left 11/01/2017   US guided biopsy - ribbon clip  . BREAST LUMPECTOMY Left 11/14/2017  . BREAST LUMPECTOMY WITH NEEDLE LOCALIZATION Left 11/14/2017   Procedure: BREAST LUMPECTOMY WITH NEEDLE LOCALIZATION;  Surgeon: Benjamine Sprague, DO;  Location: [facility] ORS;  Service: General;  Laterality: Left;  . COLONOSCOPY WITH PROPOFOL    . SENTINEL NODE BIOPSY Left 11/14/2017   Procedure: SENTINEL NODE BIOPSY;  Surgeon: Benjamine Sprague, DO;  Location: [facility] ORS;  Service: General;  Laterality: Left;   Patient Active Problem List   Diagnosis Date Noted  . Goals of care, counseling/discussion 11/22/2017  . Malignant neoplasm of upper-outer quadrant of left female breast ([place]) 11/12/2017  . Fibrocystic breast disease 06/07/2016  . Benign essential hypertension 03/20/2016  . Chronic anemia  03/20/2016  . Esophagitis, reflux 03/20/2016  . Degenerative disc disease, lumbar 09/15/2014      Prior to Admission medications   Medication Sig Start Date End Date Taking? Authorizing Provider  azelastine (ASTELIN) 0.1 % nasal spray Place 1 spray into both nostrils 2 (two) times daily. Use in each nostril as directed    [provider]  bimatoprost (LUMIGAN) 0.01 % SOLN Place 1 drop into the right eye at bedtime.     [provider]  Calcium Carb-Cholecalciferol (CALCIUM-VITAMIN D3) 600-500 MG-UNIT CAPS Take 2 tablets by mouth.    [provider]  Cholecalciferol (VITAMIN D) 2000 units tablet Take 2,000 Units by mouth daily.    [provider]  gabapentin (NEURONTIN) 300 MG capsule Take 600 mg by mouth at bedtime.  01/31/16   [provider]  HYDROcodone-acetaminophen (NORCO) 5-325 MG tablet Take 1 tablet by mouth every 4 (four) hours as needed for moderate pain. 10/10/18   Merlyn Lot, MD  ibuprofen (ADVIL,MOTRIN) 800 MG tablet Take 1 tablet (800 mg total) by mouth every 8 (eight) hours as needed for mild pain or moderate pain. Patient not taking: Reported on 09/22/2018 11/14/17   Benjamine Sprague, DO  letrozole [facility]) 2.5 MG tablet Take 1 tablet (2.5 mg total) by mouth daily. 07/28/18   Sindy Guadeloupe, MD  meloxicam (MOBIC) 7.5 MG tablet Take 7.5 mg by mouth daily.  04/22/16   [provider]  Netarsudil-Latanoprost (ROCKLATAN) 0.02-0.005 % SOLN Apply to eye every morning.    [provider]  olmesartan-hydrochlorothiazide (BENICAR HCT) 20-12.5 MG tablet Take 0.5 tablets by mouth daily.    [provider]  pantoprazole (PROTONIX) 40 MG tablet Take 40 mg by mouth daily.  04/22/16   [provider]  polyethylene glycol (MIRALAX / GLYCOLAX) 17 g packet Take 17 g by mouth daily. Mix one tablespoon with 8oz of your favorite juice or water every day until you are having soft formed stools. Then start taking once daily  if you didn't have a stool the day before. 10/10/18   Merlyn Lot, MD  sennosides-docusate sodium (SENOKOT-S) 8.6-50 MG tablet Take 2 tablets by mouth at bedtime.    [provider]  timolol (BETIMOL) 0.5 % ophthalmic solution Place 1 drop into both eyes 2 (two) times daily.    [provider]  Turmeric 500 MG CAPS Take 1 capsule by mouth daily.    [provider]  vitamin B-12 (CYANOCOBALAMIN) 1000 MCG tablet Take 1,000 mcg by mouth daily.  03/03/16   [provider]    Allergies Ace inhibitors and Tape    Social History Social History   Tobacco Use  . Smoking status: Former Smoker    Packs/day: 0.25    Quit date: 06/08/1970    Years since quitting: 48.3  . Smokeless tobacco: Never Used  Substance Use Topics  . Alcohol use: Yes    Alcohol/week: 3.0 standard drinks    Types: 3 Glasses of wine per week    Comment: 1 glass of wine daily.  . Drug use: No    Review of Systems Patient denies headaches, rhinorrhea, blurry vision, numbness, shortness of breath, chest pain, edema, cough, abdominal pain, nausea, vomiting, diarrhea, dysuria, fevers, rashes or hallucinations unless otherwise stated above in HPI. ____________________________________________   PHYSICAL EXAM:  VITAL SIGNS: Vitals:   10/10/18 2034 10/10/18 2250  BP: (!) 146/68 (!) 144/81  Pulse: 62 68  Resp: 18 16  Temp: 98 F (36.7 C)   SpO2: 97% 98%    Constitutional: Alert and oriented.  Eyes: Conjunctivae are normal.  Head: Atraumatic. Nose: No congestion/rhinnorhea. Mouth/Throat: Mucous membranes are moist.   Neck: No stridor. Painless ROM.  Cardiovascular: Normal rate, regular rhythm. Grossly normal heart sounds.  Good peripheral circulation. Respiratory: Normal respiratory effort.  No retractions. Lungs CTAB. Gastrointestinal: Soft and nontender. No distention. No abdominal bruits. No CVA tenderness. Genitourinary:  Musculoskeletal: Deformity to left shoulder.   Neurovascular intact distally.  Small abrasion to the radial left thumb.  No lower extremity tenderness nor edema.  No joint effusions. Neurologic:  Normal speech and language. No gross focal neurologic deficits are appreciated. No facial droop Skin:  Skin is warm, dry and intact. No rash noted. Psychiatric: Mood and affect are normal. Speech and behavior are normal.  ____________________________________________   LABS (all labs ordered are listed, but only abnormal results are displayed)  No results found for this or any previous visit (from the past 24 hour(s)). ____________________________________________  ____________________________________________  [id number]  I personally reviewed all radiographic images ordered to evaluate for the above acute complaints and reviewed radiology reports and findings.  These findings were personally discussed with the patient.  Please see medical record for radiology report.  ____________________________________________   PROCEDURES  Procedure(s) performed:  .Ortho Injury Treatment  Date/Time: 10/10/2018 11:53 PM Performed by: Merlyn Lot, MD Authorized by: Merlyn Lot, MD   Consent:  Consent obtained:  Verbal   Consent given by:  Patient   Risks discussed:  Fracture, irreducible dislocation, recurrent dislocation, stiffness, restricted joint movement and vascular damage   Alternatives discussed:  No treatment, alternative treatment and immobilizationInjury location: shoulder Location details: left shoulder Injury type: fracture-dislocation Dislocation type: anterior Fracture type: greater humeral tuberosity Pre-procedure neurovascular assessment: neurovascularly intact Pre-procedure distal perfusion: normal Pre-procedure neurological function: normal Pre-procedure range of motion: reduced Manipulation performed: yes Skin traction used: yes Reduction successful: yes X-ray confirmed reduction: yes Immobilization: sling  Post-procedure neurovascular assessment: post-procedure neurovascularly intact       Critical Care performed: no ____________________________________________   INITIAL IMPRESSION / ASSESSMENT AND PLAN / ED COURSE  Pertinent labs & imaging results that were available during my care of the patient were reviewed by me and considered in my medical decision making (see chart for details).   DDX: Dislocation, contusion, fracture  Megan Briggs is a 68 y.o. who presents to the ED with symptoms as described above.  Patient with obvious deformity to left upper extremity.  No head injury neck injury.  No other associated trauma.  Discussed dislocation and fracture pattern with Dr. Mack Guise of orthopedics agrees with reduction in the ER.  Clinical Course as of Oct 09 2353  Fri Oct 10, 2018  2235 Reduction was successful.  Discussed close follow-up with orthopedics.  Have discussed with the patient and available family all diagnostics and treatments performed thus far and all questions were answered to the best of my ability. The patient demonstrates understanding and agreement with plan.    [PR]    Clinical Course User Index [PR] Merlyn Lot, MD    The patient was evaluated in Emergency Department today for the symptoms described in the history of present illness. He/she was evaluated in the context of the global COVID-19 pandemic, which necessitated consideration that the patient might be at risk for infection with the SARS-CoV-2 virus that causes COVID-19. Institutional protocols and algorithms that pertain to the evaluation of patients at risk for COVID-19 are in a state of rapid change based on information released by regulatory bodies including the CDC and federal and state organizations. These policies and algorithms were followed during the patient's care in the ED.  As part of my medical decision making, I reviewed the following data within the [place] notes reviewed and incorporated, Labs reviewed, notes from prior ED visits and [place] Controlled Substance Database   ____________________________________________   FINAL CLINICAL IMPRESSION(S) / ED DIAGNOSES  Final diagnoses:  Shoulder dislocation, left, initial encounter      NEW MEDICATIONS STARTED DURING THIS VISIT:  Discharge Medication List as of 10/10/2018 10:38 PM    START taking these medications   Details  HYDROcodone-acetaminophen (NORCO) 5-325 MG tablet Take 1 tablet by mouth every 4 (four) hours as needed for moderate pain., Starting Fri 10/10/2018, Normal    polyethylene glycol (MIRALAX / GLYCOLAX) 17 g packet Take 17 g by mouth daily. Mix one tablespoon with 8oz of your favorite juice or water every day until you are having soft formed stools. Then start taking once daily if you didn't have a stool the day before., Starting Fri 10/10/2018, Normal         Note:  This document was prepared using Dragon voice recognition software and may include unintentional dictation errors.    Merlyn Lot, MD 10/10/18 2355

## 2018-10-10 NOTE — ED Triage Notes (Signed)
Patient reports she had been out walking and shoe caught on drive way and she tripped.  Patient reports left shoulder pain.

## 2018-10-13 ENCOUNTER — Other Ambulatory Visit: Payer: Medicare PPO

## 2018-10-30 ENCOUNTER — Other Ambulatory Visit: Payer: Self-pay | Admitting: Oncology

## 2018-11-14 ENCOUNTER — Ambulatory Visit
Admission: RE | Admit: 2018-11-14 | Discharge: 2018-11-14 | Disposition: A | Discharge status: Home / Self Care | Payer: Medicare PPO | Source: Ambulatory Visit | Attending: Oncology | Admitting: Oncology

## 2018-11-14 DIAGNOSIS — C50412 Malignant neoplasm of upper-outer quadrant of left female breast: Secondary | ICD-10-CM

## 2019-01-08 ENCOUNTER — Other Ambulatory Visit: Payer: Self-pay | Admitting: Oncology

## 2019-02-10 ENCOUNTER — Other Ambulatory Visit: Payer: Self-pay

## 2019-02-11 ENCOUNTER — Other Ambulatory Visit: Payer: Self-pay

## 2019-02-11 ENCOUNTER — Ambulatory Visit
Admission: RE | Admit: 2019-02-11 | Discharge: 2019-02-11 | Disposition: A | Discharge status: Home / Self Care | Payer: Medicare PPO | Source: Ambulatory Visit | Attending: Radiation Oncology | Admitting: Radiation Oncology

## 2019-02-11 ENCOUNTER — Encounter: Payer: Self-pay | Admitting: Radiation Oncology

## 2019-02-11 VITALS — BP 151/88 | HR 60 | Temp 98.0°F | Wt 161.5 lb

## 2019-02-11 DIAGNOSIS — Z923 Personal history of irradiation: Secondary | ICD-10-CM | POA: Insufficient documentation

## 2019-02-11 DIAGNOSIS — Z79811 Long term (current) use of aromatase inhibitors: Secondary | ICD-10-CM | POA: Diagnosis not present

## 2019-02-11 DIAGNOSIS — Z170 Estrogen receptor positive status [ER+]: Secondary | ICD-10-CM | POA: Diagnosis not present

## 2019-02-11 DIAGNOSIS — C50412 Malignant neoplasm of upper-outer quadrant of left female breast: Secondary | ICD-10-CM | POA: Diagnosis not present

## 2019-02-11 NOTE — Progress Notes (Signed)
Radiation Oncology Follow up Note  Name: Megan Briggs   Date:   02/11/2019 MRN:  [id number] DOB: Feb 12, 1950    This 69 y.o. female presents to the clinic today for 1 year follow-up status post whole breast radiation to her left breast for stage I ER/PR positive invasive mammary carcinoma.  REFERRING PROVIDER: Rusty Aus, MD  HPI: Patient is a 69 year old female now out 1 year having pleated whole breast radiation to her left breast for stage I ER/PR positive invasive mammary carcinoma. Seen today in routine follow-up she is doing well. She specifically denies breast tenderness cough or bone pain. Unfortunately in the past 6 months she had a traumatic fall and separated her left shoulder that has healed at this time and she is doing well. She specifically denies breast tenderness cough or bone pain.. She is currently on letrozole tolerating that well without side effect. She had mammograms back in October which I have reviewed were BI-RADS 2 benign  COMPLICATIONS OF TREATMENT: none  FOLLOW UP COMPLIANCE: keeps appointments   PHYSICAL EXAM:  BP (!) 151/88   Pulse 60   Temp 98 F (36.7 C)   Wt 161 lb 8 oz (73.3 kg)   BMI 26.88 kg/m  Lungs are clear to A&P cardiac examination essentially unremarkable with regular rate and rhythm. No dominant mass or nodularity is noted in either breast in 2 positions examined. Incision is well-healed. No axillary or supraclavicular adenopathy is appreciated. Cosmetic result is excellent. Well-developed well-nourished patient in NAD. HEENT reveals PERLA, EOMI, discs not visualized.  Oral cavity is clear. No oral mucosal lesions are identified. Neck is clear without evidence of cervical or supraclavicular adenopathy. Lungs are clear to A&P. Cardiac examination is essentially unremarkable with regular rate and rhythm without murmur rub or thrill. Abdomen is benign with no organomegaly or masses noted. Motor sensory and DTR levels are equal and symmetric in the  upper and lower extremities. Cranial nerves II through XII are grossly intact. Proprioception is intact. No peripheral adenopathy or edema is identified. No motor or sensory levels are noted. Crude visual fields are within normal range.  RADIOLOGY RESULTS: Mammograms reviewed compatible with above-stated findings  PLAN: Present time patient is now 1 year out doing well with no evidence of disease. I am pleased with her overall progress. She continues on letrozole without side effect. I have asked to see her back in 1 year for follow-up. Patient knows to call at anytime with any concerns.  I would like to take this opportunity to thank you for allowing me to participate in the care of your patient.Noreene Filbert, MD

## 2019-03-13 ENCOUNTER — Other Ambulatory Visit: Payer: Self-pay

## 2019-03-13 ENCOUNTER — Encounter: Payer: Self-pay | Admitting: Oncology

## 2019-03-13 DIAGNOSIS — C50412 Malignant neoplasm of upper-outer quadrant of left female breast: Secondary | ICD-10-CM

## 2019-03-13 NOTE — Progress Notes (Signed)
Patient stated that she had been doing well with no complaints. Patient stated that she is tolerating her Letrozole well and would like a refill on her Letrozole.

## 2019-03-15 MED ORDER — LETROZOLE 2.5 MG PO TABS: 2.5000 mg | ORAL_TABLET | Freq: Every day | ORAL | 3 refills | Status: DC

## 2019-03-16 ENCOUNTER — Inpatient Hospital Stay: Payer: Medicare PPO | Attending: Oncology | Admitting: Oncology

## 2019-03-16 DIAGNOSIS — Z853 Personal history of malignant neoplasm of breast: Secondary | ICD-10-CM | POA: Diagnosis not present

## 2019-03-16 DIAGNOSIS — Z79811 Long term (current) use of aromatase inhibitors: Secondary | ICD-10-CM

## 2019-03-16 DIAGNOSIS — Z08 Encounter for follow-up examination after completed treatment for malignant neoplasm: Secondary | ICD-10-CM | POA: Diagnosis not present

## 2019-03-16 MED ORDER — LETROZOLE 2.5 MG PO TABS: 2.5000 mg | ORAL_TABLET | Freq: Every day | ORAL | 3 refills | Status: DC

## 2019-03-16 NOTE — Progress Notes (Signed)
I connected with Megan Briggs on 03/16/19 at  1:00 PM EST by video enabled telemedicine visit and verified that I am speaking with the correct person using two identifiers.   I discussed the limitations, risks, security and privacy concerns of performing an evaluation and management service by telemedicine and the availability of in-person appointments. I also discussed with the patient that there may be a patient responsible charge related to this service. The patient expressed understanding and agreed to proceed.  Other persons participating in the visit and their role in the encounter:  none  Patient's location:  home Provider's location:  work  Risk analyst Complaint: Routine follow-up of breast cancer  History of present illness: Patient is a 69 year old seen in the past for normocytic anemia. She is now been referred to me for new diagnosis of breast cancer. Patient had a screening mammogram on 10/16/2017 which showed suspicious mass in the upper outer quadrant of the left breast 6 x 5 x 4 mm in size which was also confirmed on ultrasound. No evidence of left axillary adenopathy. Patient underwent biopsy of the breast mass which showed grade 1, 1 mm invasive mammary carcinoma. ER PR status was not obtained due to the small size of the breast biopsy.  Patient underwent lumpectomy and sentinel lymph node biopsy on 11/14/2017. 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 One sentinel lymph node was negative for malignancy. pT1b pN0.Given that her tumor was less than 1 cm and grade 1 she did not require any Oncotype testing and adjuvant chemotherapy. Patient seen by radiation oncology and completes adjuvant radiation tomorrow  Patient was started on Arimidex in December 2019. Baseline bone density scan showed osteopenia with a score of -1.5. 10-year probability of a major osteoporotic fracture was 15% and hip fracture was 2.5%.  Patient  reported significant body aches and arthralgias with Arimidex and was switched to letrozole  Interval history reports doing well and denies any complaints at this time.  Appetite and weight are stable.  Reports no new aches or pains anywhere   Review of Systems  Constitutional: Negative for chills, fever, malaise/fatigue and weight loss.  HENT: Negative for congestion, ear discharge and nosebleeds.   Eyes: Negative for blurred vision.  Respiratory: Negative for cough, hemoptysis, sputum production, shortness of breath and wheezing.   Cardiovascular: Negative for chest pain, palpitations, orthopnea and claudication.  Gastrointestinal: Negative for abdominal pain, blood in stool, constipation, diarrhea, heartburn, melena, nausea and vomiting.  Genitourinary: Negative for dysuria, flank pain, frequency, hematuria and urgency.  Musculoskeletal: Negative for back pain, joint pain and myalgias.  Skin: Negative for rash.  Neurological: Negative for dizziness, tingling, focal weakness, seizures, weakness and headaches.  Endo/Heme/Allergies: Does not bruise/bleed easily.  Psychiatric/Behavioral: Negative for depression and suicidal ideas. The patient does not have insomnia.     Allergies  Allergen Reactions  . Ace Inhibitors Anaphylaxis and Swelling    Lisinopril   . Tape Other (See Comments)    Band-aids caused redness and irritation Band-aids caused redness and irritation    Past Medical History:  Diagnosis Date  . Anemia   . Cancer ([place])    Left breast  . Glaucoma   . Hypertension   . Neuropathy   . Personal history of radiation therapy 10/2017   LEFT lumpectomy    Past Surgical History:  Procedure Laterality Date  . BACK SURGERY    . BREAST BIOPSY Right 1995   benign  . BREAST  BIOPSY Left 11/01/2017   US guided biopsy - ribbon clip  . BREAST LUMPECTOMY Left 11/14/2017   INVASIVE MAMMARY CARCINOMA  . BREAST LUMPECTOMY WITH NEEDLE LOCALIZATION Left 11/14/2017   Procedure:  BREAST LUMPECTOMY WITH NEEDLE LOCALIZATION;  Surgeon: Benjamine Sprague, DO;  Location: [facility] ORS;  Service: General;  Laterality: Left;  . COLONOSCOPY WITH PROPOFOL    . SENTINEL NODE BIOPSY Left 11/14/2017   Procedure: SENTINEL NODE BIOPSY;  Surgeon: Benjamine Sprague, DO;  Location: [facility] ORS;  Service: General;  Laterality: Left;    Social History   Socioeconomic History  . Marital status: Married    Spouse name: Not on file  . Number of children: Not on file  . Years of education: Not on file  . Highest education level: Not on file  Occupational History  . Not on file  Tobacco Use  . Smoking status: Former Smoker    Packs/day: 0.25    Quit date: 06/08/1970    Years since quitting: 48.8  . Smokeless tobacco: Never Used  Substance and Sexual Activity  . Alcohol use: Yes    Alcohol/week: 3.0 standard drinks    Types: 3 Glasses of wine per week    Comment: 1 glass of wine daily.  . Drug use: No  . Sexual activity: Yes  Other Topics Concern  . Not on file  Social History Narrative  . Not on file   Social Determinants of Health   Financial Resource Strain:   . Difficulty of Paying Living Expenses: Not on file  Food Insecurity:   . Worried About Charity fundraiser in the Last Year: Not on file  . Ran Out of Food in the Last Year: Not on file  Transportation Needs:   . Lack of Transportation (Medical): Not on file  . Lack of Transportation (Non-Medical): Not on file  Physical Activity:   . Days of Exercise per Week: Not on file  . Minutes of Exercise per Session: Not on file  Stress:   . Feeling of Stress : Not on file  Social Connections:   . Frequency of Communication with Friends and Family: Not on file  . Frequency of Social Gatherings with Friends and Family: Not on file  . Attends Religious Services: Not on file  . Active Member of Clubs or Organizations: Not on file  . Attends Archivist Meetings: Not on file  . Marital Status: Not on file  Intimate Partner  Violence:   . Fear of Current or Ex-Partner: Not on file  . Emotionally Abused: Not on file  . Physically Abused: Not on file  . Sexually Abused: Not on file    Family History  Problem Relation Age of Onset  . Breast cancer Cousin   . Breast cancer Cousin   . Breast cancer Paternal Aunt      Current Outpatient Medications:  .  bimatoprost (LUMIGAN) 0.01 % SOLN, Place 1 drop into the right eye at bedtime. , Disp: , Rfl:  .  Calcium Carb-Cholecalciferol (CALCIUM-VITAMIN D3) 600-500 MG-UNIT CAPS, Take 2 tablets by mouth., Disp: , Rfl:  .  Cholecalciferol (VITAMIN D) 2000 units tablet, Take 2,000 Units by mouth daily., Disp: , Rfl:  .  gabapentin (NEURONTIN) 300 MG capsule, Take 600 mg by mouth at bedtime. , Disp: , Rfl:  .  HYDROcodone-acetaminophen (NORCO) 5-325 MG tablet, Take 1 tablet by mouth every 4 (four) hours as needed for moderate pain., Disp: 10 tablet, Rfl: 0 .  meloxicam (MOBIC) 7.5  MG tablet, Take 7.5 mg by mouth daily. , Disp: , Rfl:  .  Netarsudil-Latanoprost (ROCKLATAN) 0.02-0.005 % SOLN, Apply to eye every morning., Disp: , Rfl:  .  olmesartan-hydrochlorothiazide (BENICAR HCT) 20-12.5 MG tablet, Take 0.5 tablets by mouth daily., Disp: , Rfl:  .  pantoprazole (PROTONIX) 40 MG tablet, Take 40 mg by mouth daily. , Disp: , Rfl:  .  sennosides-docusate sodium (SENOKOT-S) 8.6-50 MG tablet, Take 2 tablets by mouth at bedtime., Disp: , Rfl:  .  timolol (BETIMOL) 0.5 % ophthalmic solution, Place 1 drop into both eyes 2 (two) times daily., Disp: , Rfl:  .  Turmeric 500 MG CAPS, Take 1 capsule by mouth daily., Disp: , Rfl:  .  vitamin B-12 (CYANOCOBALAMIN) 1000 MCG tablet, Take 1,000 mcg by mouth daily. , Disp: , Rfl:  .  azelastine (ASTELIN) 0.1 % nasal spray, Place 1 spray into both nostrils 2 (two) times daily. Use in each nostril as directed, Disp: , Rfl:  .  ibuprofen (ADVIL,MOTRIN) 800 MG tablet, Take 1 tablet (800 mg total) by mouth every 8 (eight) hours as needed for mild  pain or moderate pain. (Patient not taking: Reported on 03/13/2019), Disp: 30 tablet, Rfl: 0 .  letrozole (FEMARA) 2.5 MG tablet, Take 1 tablet (2.5 mg total) by mouth daily., Disp: 90 tablet, Rfl: 3 .  polyethylene glycol (MIRALAX / GLYCOLAX) 17 g packet, Take 17 g by mouth daily. Mix one tablespoon with 8oz of your favorite juice or water every day until you are having soft formed stools. Then start taking once daily if you didn't have a stool the day before. (Patient not taking: Reported on 03/13/2019), Disp: 30 each, Rfl: 0  No results found.  No images are attached to the encounter.   CMP Latest Ref Rng & Units 03/10/2018  Glucose 70 - 99 mg/dL 116(H)  BUN 8 - 23 mg/dL 30(H)  Creatinine 0.44 - 1.00 mg/dL 1.30(H)  Sodium 135 - 145 mmol/L 138  Potassium 3.5 - 5.1 mmol/L 4.2  Chloride 98 - 111 mmol/L 104  CO2 22 - 32 mmol/L 27  Calcium 8.9 - 10.3 mg/dL 9.2  Total Protein 6.5 - 8.1 g/dL 7.3  Total Bilirubin 0.3 - 1.2 mg/dL 0.6  Alkaline Phos 38 - 126 U/L 67  AST 15 - 41 U/L 26  ALT 0 - 44 U/L 24   CBC Latest Ref Rng & Units 01/06/2018  WBC 4.0 - 10.5 K/uL 3.4(L)  Hemoglobin 12.0 - 15.0 g/dL 10.3(L)  Hematocrit 36.0 - 46.0 % 32.9(L)  Platelets 150 - 400 K/uL 211     Observation/objective: Appears in no acute distress over video visit today.  Breathing is nonlabored  Assessment and plan:Patient is a 69 y.o. female with pathologic prognostic stage Ia invasive mammary carcinoma of the left breast grade [id number] ER PR positive HER-2/neu negative status post lumpectomy and adjuvant radiation.  She is currently on letrozole and this is a routine follow-up visit for breast cancer  Clinically patient is doing well and no concerning signs and symptoms of recurrence. She is due for a repeat mammogram in October 2021 which we will schedule.  Mammogram from October 2020 did not reveal any evidence of malignancy.  She will continue taking letrozole along with calcium and vitamin D which she is  tolerating it well without any significant side effects.  She has baseline osteopenia and will need a repeat bone density scan next year.  Follow-up instructions: I will see her in 6 months for  a breast exam  I discussed the assessment and treatment plan with the patient. The patient was provided an opportunity to ask questions and all were answered. The patient agreed with the plan and demonstrated an understanding of the instructions.   The patient was advised to call back or seek an in-person evaluation if the symptoms worsen or if the condition fails to improve as anticipated.  Visit Diagnosis: 1. Encounter for follow-up surveillance of breast cancer   2. Use of letrozole (Femara)     Dr. Randa Evens, MD, MPH [facility] at [facility] Tel- [phone] 03/16/2019 5:24 PM

## 2019-03-19 ENCOUNTER — Ambulatory Visit: Payer: Medicare PPO | Admitting: Oncology

## 2019-03-27 ENCOUNTER — Ambulatory Visit: Payer: Medicare PPO

## 2019-04-29 IMAGING — MG US BREAST BX W LOC DEV 1ST LESION IMG BX SPEC US GUIDE*L*
1 series · 8 of 8 positions shown · non-contrast
Comparison: Previous exam(s).

ADDENDUM:
PATHOLOGY ADDENDUM:

Pathology: Invasive mammary carcinoma
Pathology concordant with imaging findings: Yes
Recommendation: Surgical excision
Localization/excision considerations: Wire localization
Per Dr. Pedraza preference, the pathology results will be
communicated by his nurse Ertel.
CLINICAL DATA: Screening detected 6 mm mass associated with
architectural distortion involving the UPPER OUTER QUADRANT of the
LEFT breast at POSTERIOR depth, 2:30 o'clock approximately 5 cm from
the nipple.
EXAM:
ULTRASOUND GUIDED LEFT BREAST CORE NEEDLE BIOPSY

[Series 1: MG view · 0.07mm/px · 8 of 12 slices shown]
[im 1/12]
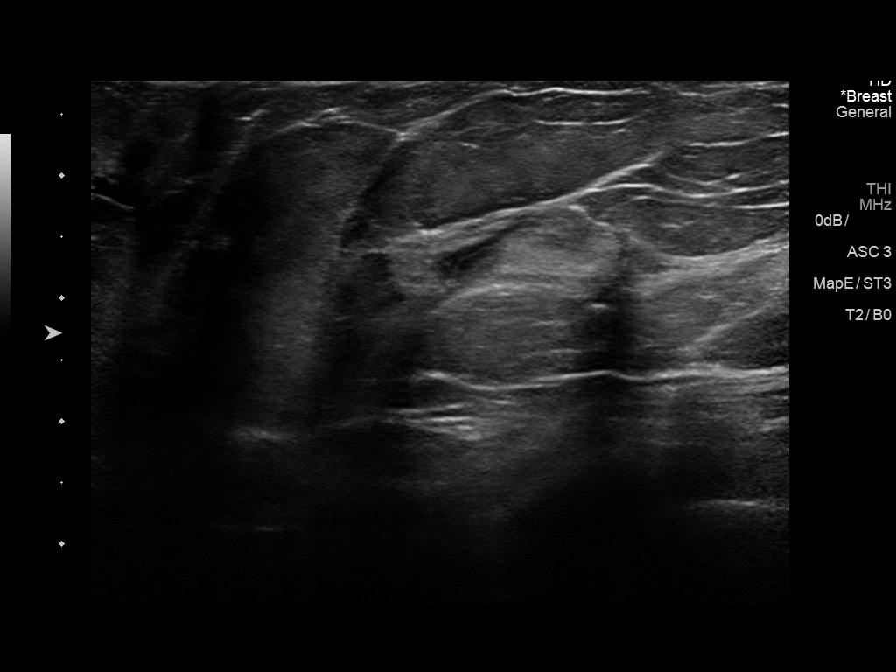
[im 2/12]
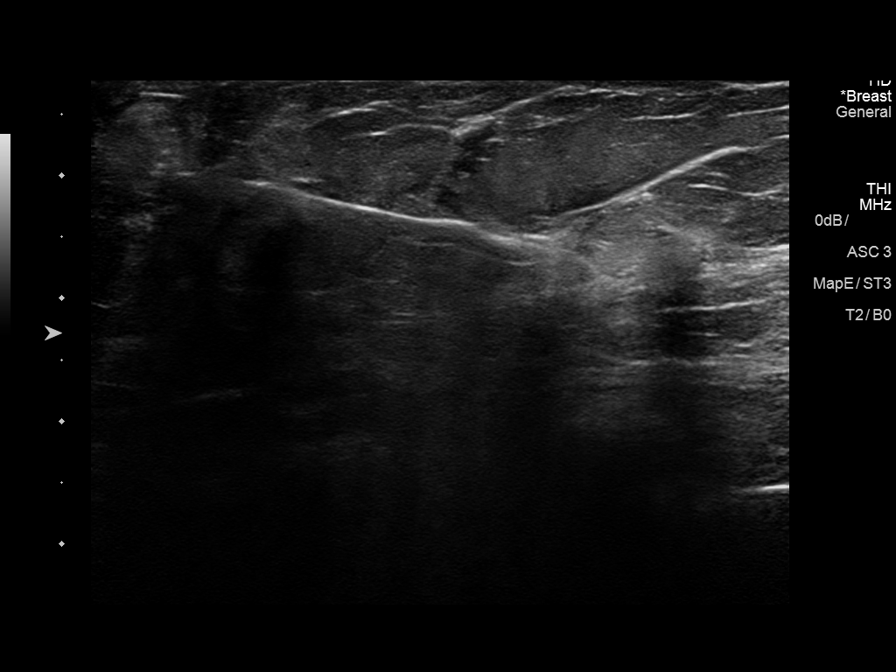
[im 4/12]
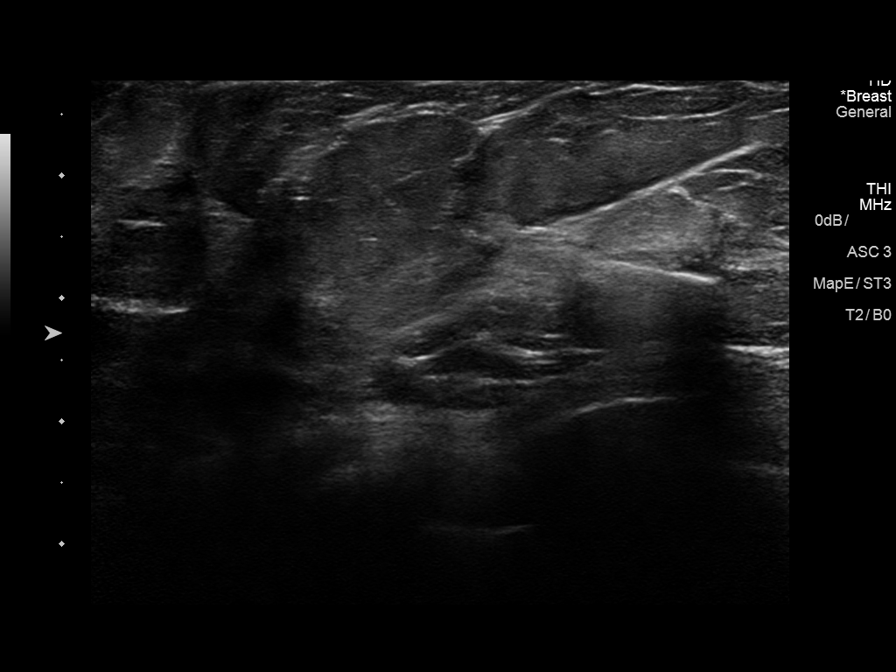
[im 5/12]
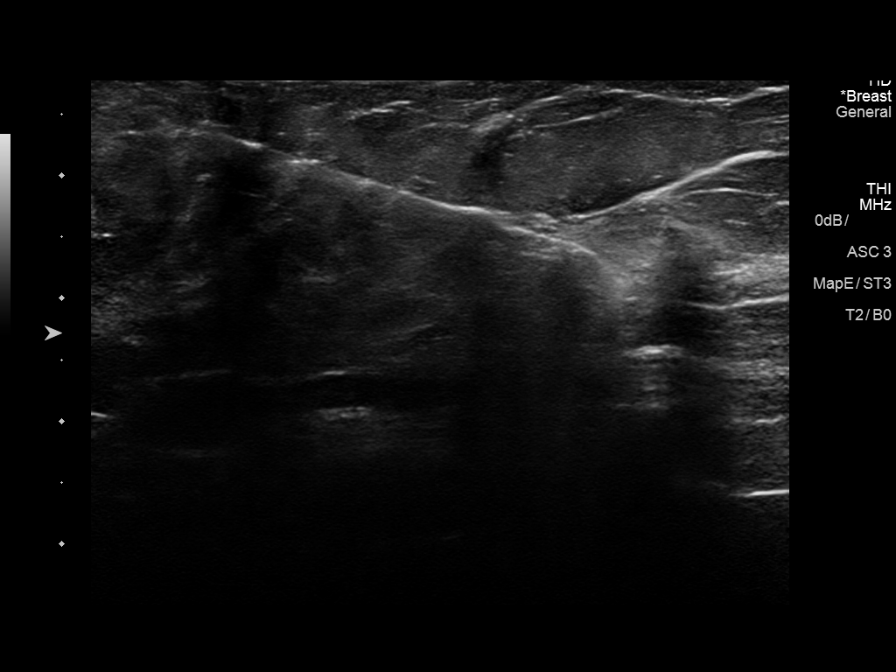
[im 7/12]
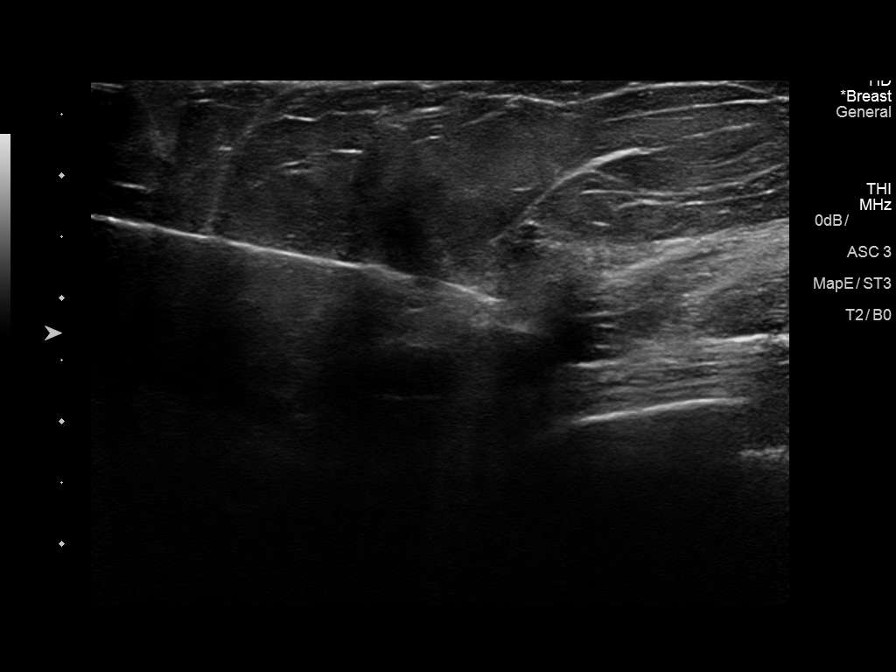
[im 8/12]
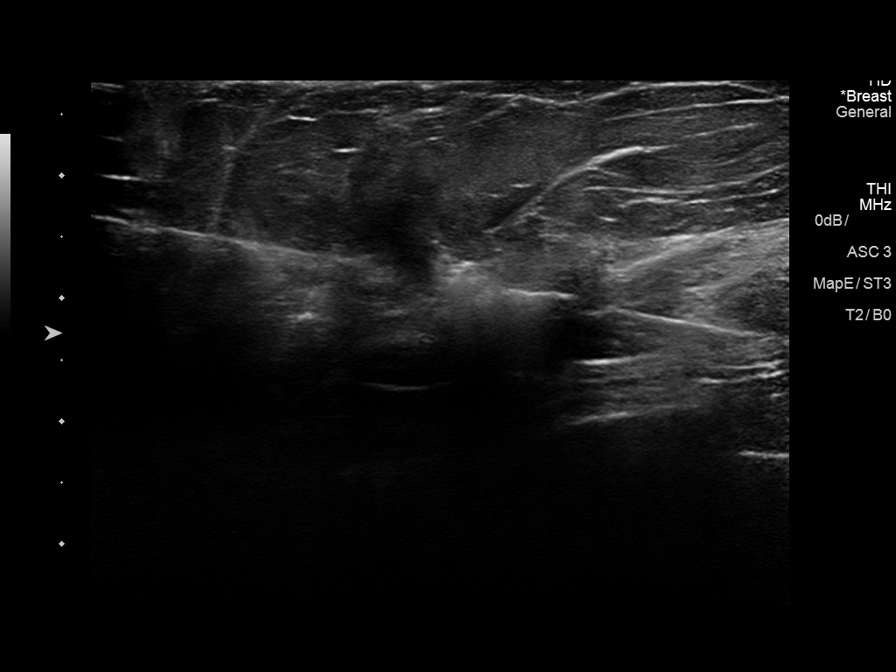
[im 10/12]
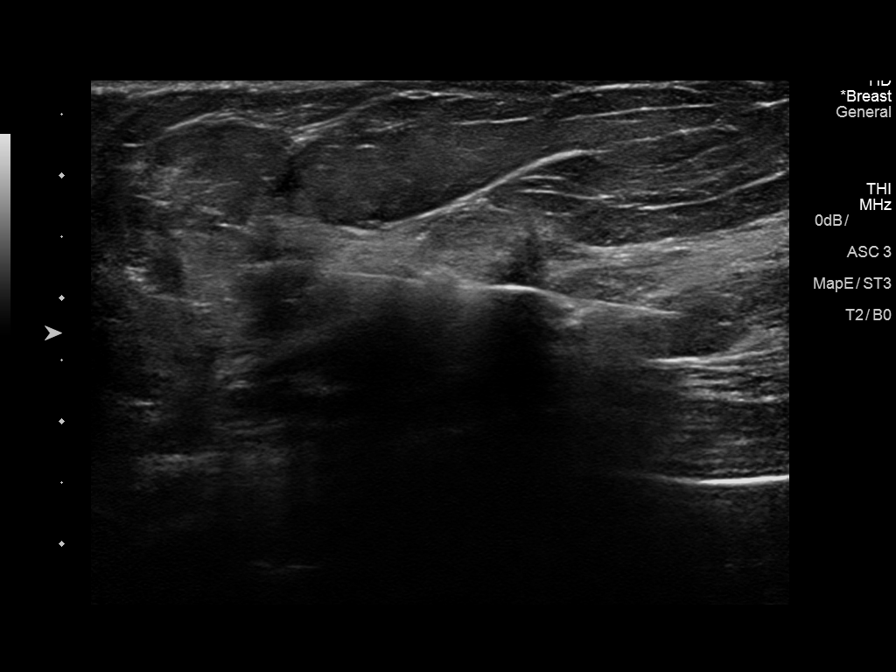
[im 12/12]
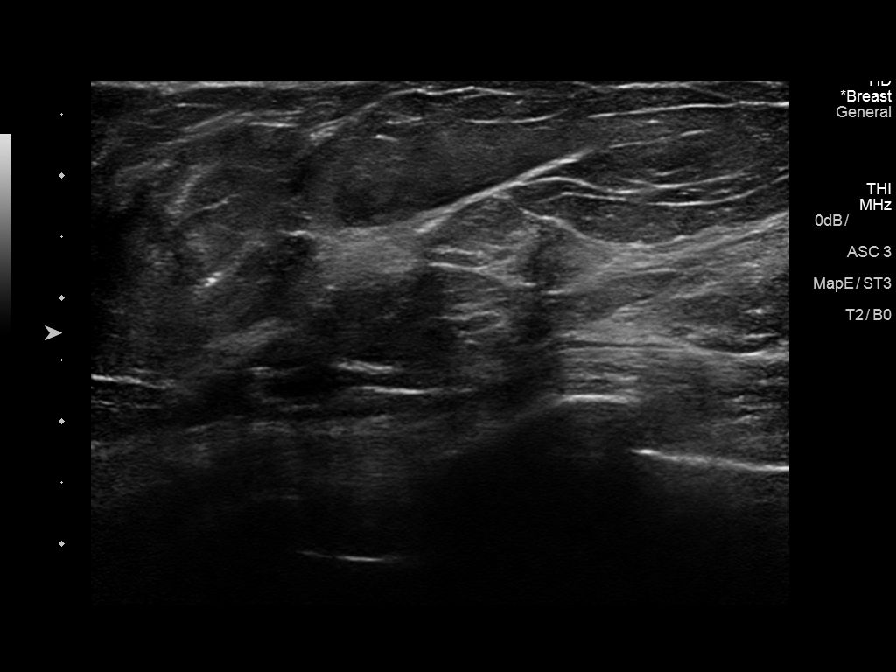

[8 of 8 positions shown; findings below may reference images not displayed]



Lesion quadrant: UPPER OUTER QUADRANT.

Using sterile technique with chlorhexidine as skin antisepsis, 1%
Lidocaine as local anesthetic, under direct ultrasound
visualization, a 12 gauge Stein Inge Mariampillai core needle device was used
to perform biopsy of the mass in the UPPER OUTER QUADRANT of the
LEFT breast using a LATERAL approach. At the conclusion of the
procedure a ribbon shaped tissue marker clip was deployed into the
biopsy cavity. Follow up 2 view mammogram was performed and dictated
separately.
IMPRESSION: Ultrasound guided biopsy of a suspicious 6 mm mass involving the
UPPER OUTER QUADRANT of the LEFT breast. No apparent complications.

## 2019-04-29 IMAGING — MG MM BREAST LOCALIZATION CLIP
4 series · 4 of 4 positions shown · non-contrast
Comparison: Previous exam(s).

CLINICAL DATA: Confirmation of clip placement after
ultrasound-guided core needle biopsy of a 6 mm mass associated with
architectural distortion in the UPPER OUTER QUADRANT of the LEFT
breast at POSTERIOR depth.

EXAM:
DIAGNOSTIC LEFT MAMMOGRAM POST ULTRASOUND BIOPSY

[L ML (1 of 2)]
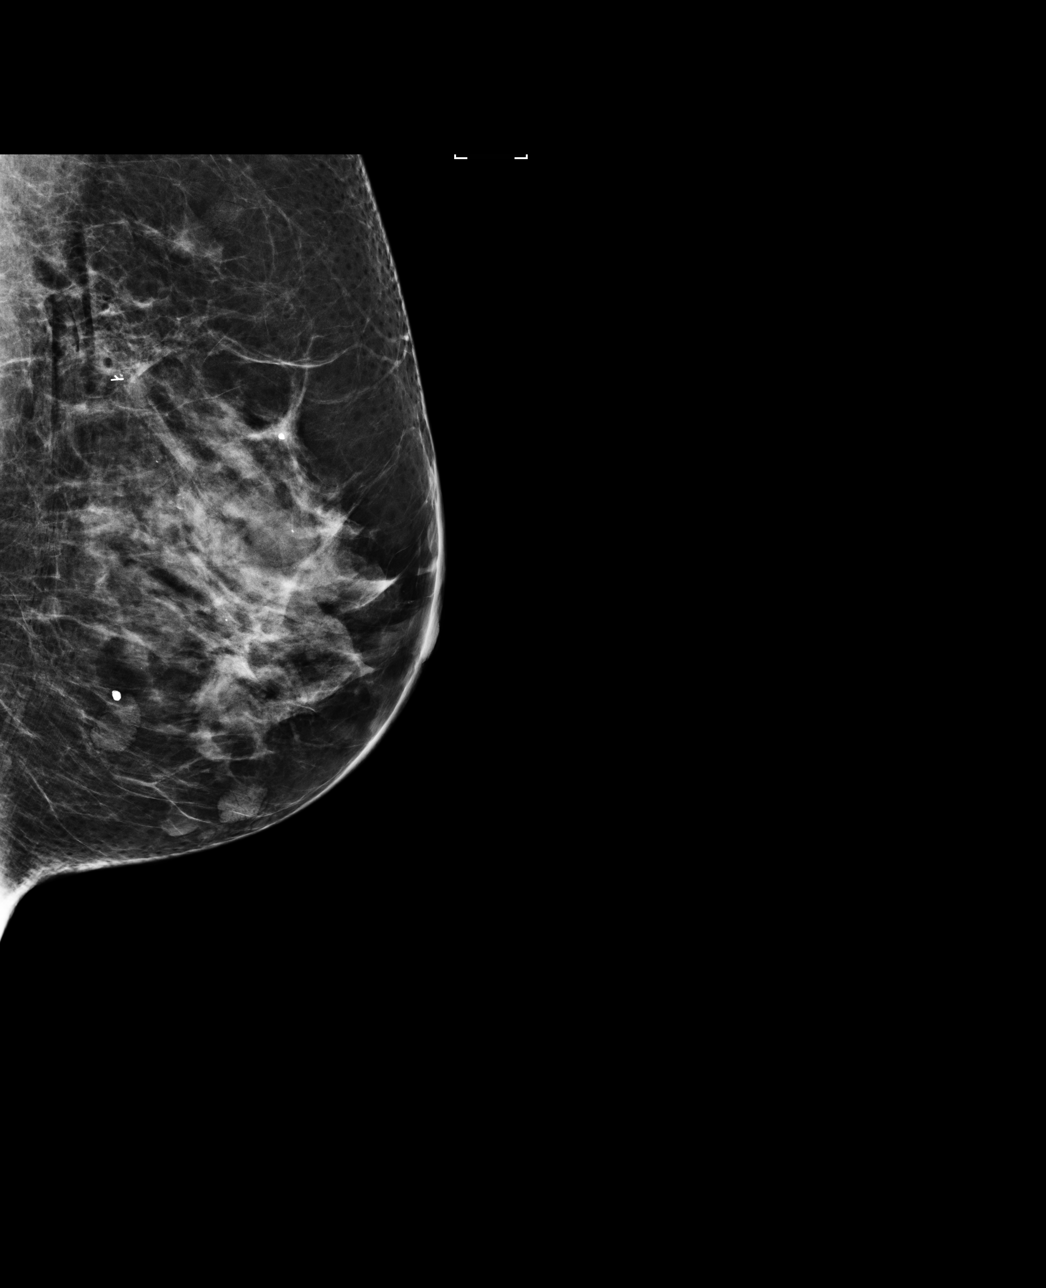

[L XCCL (1 of 2)]
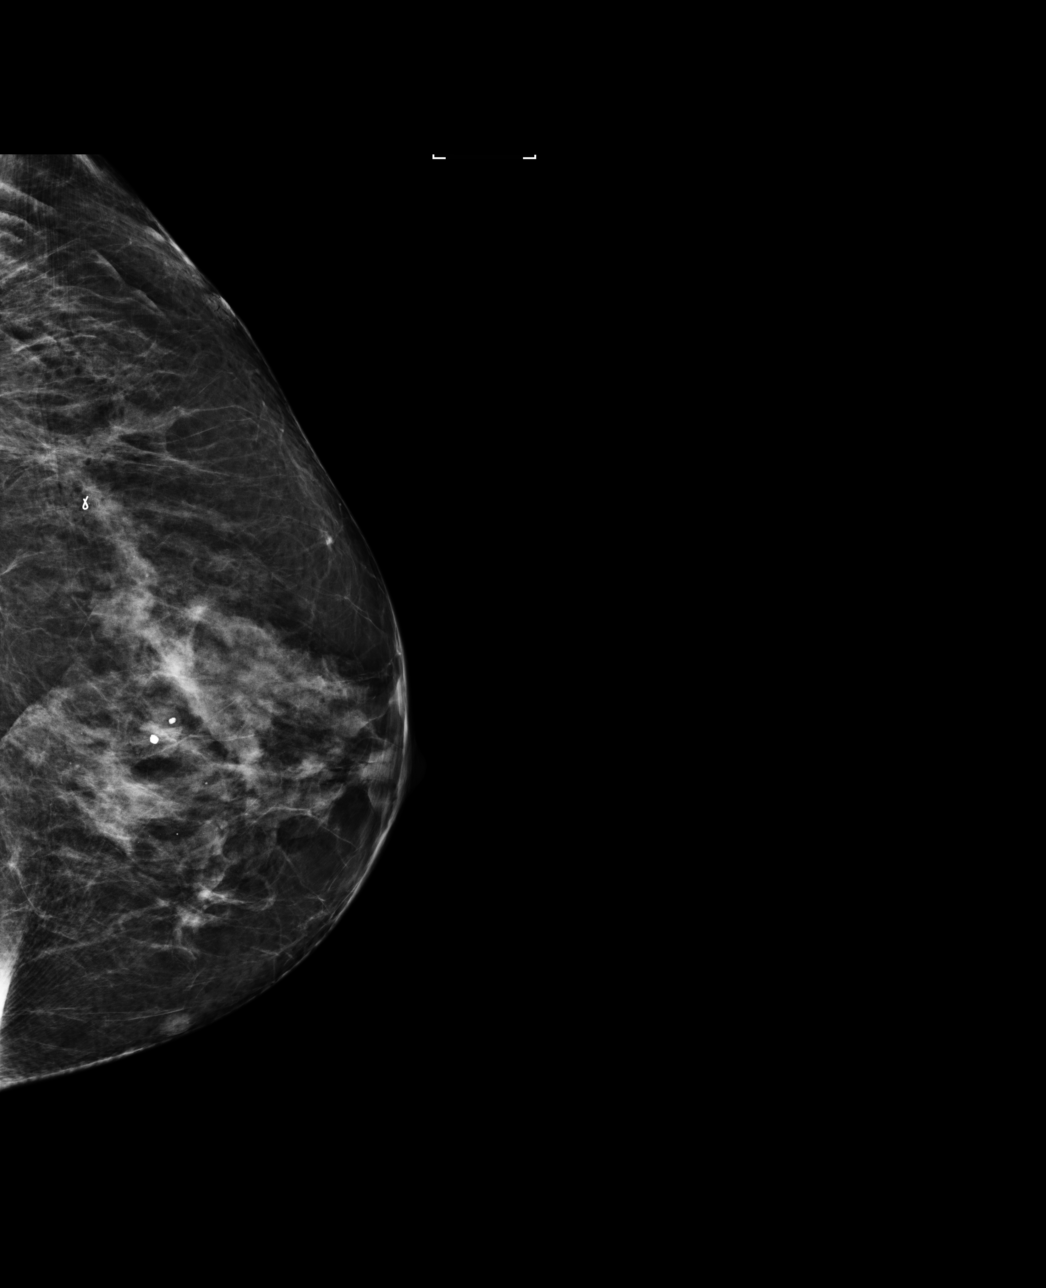

[L XCCL (2 of 2)]
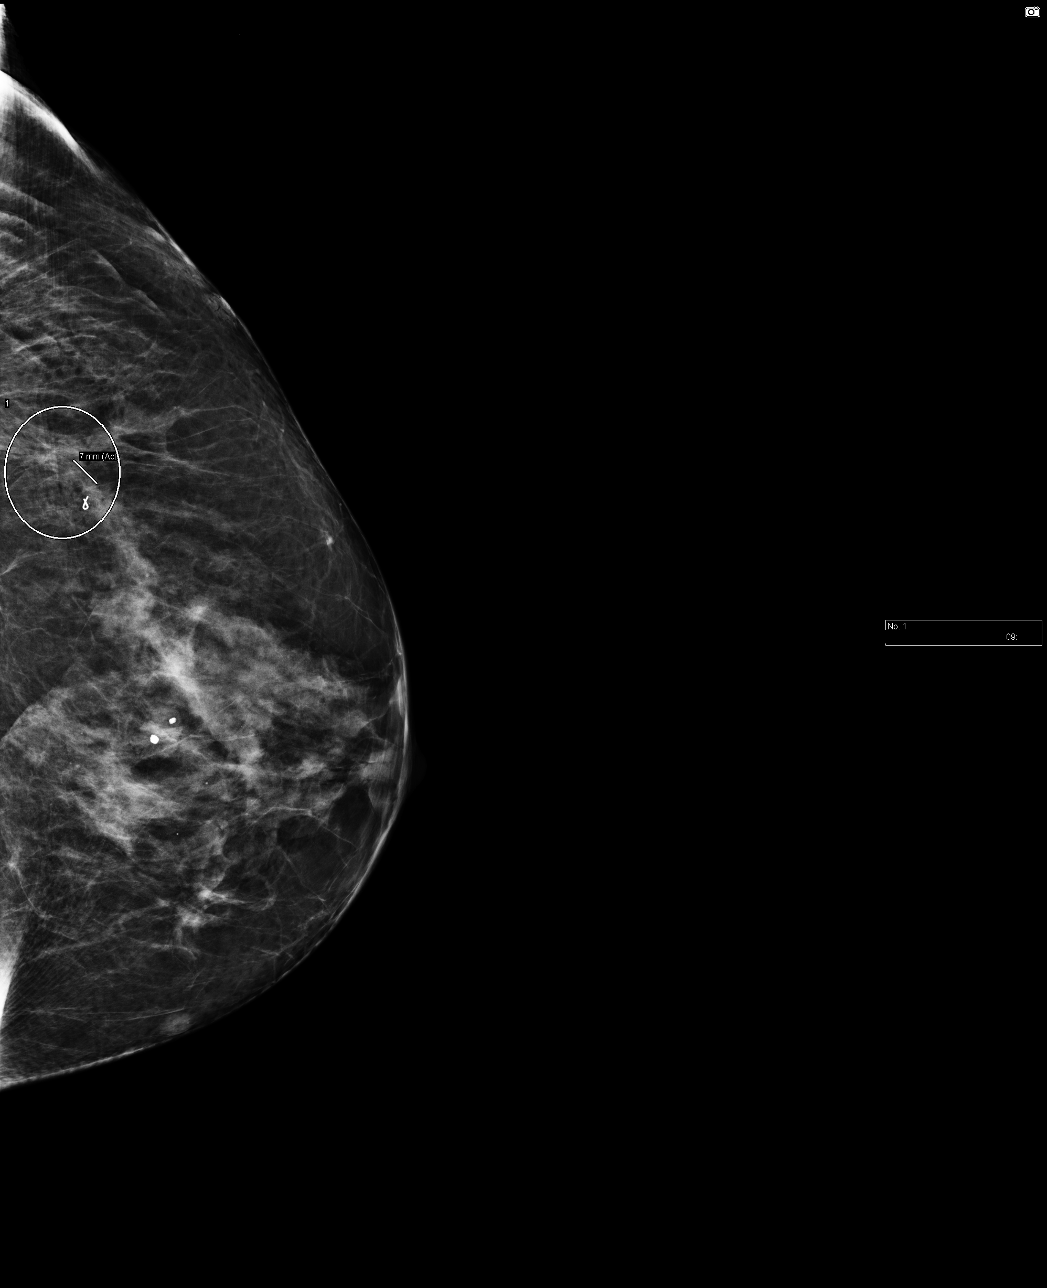

[L ML (2 of 2)]
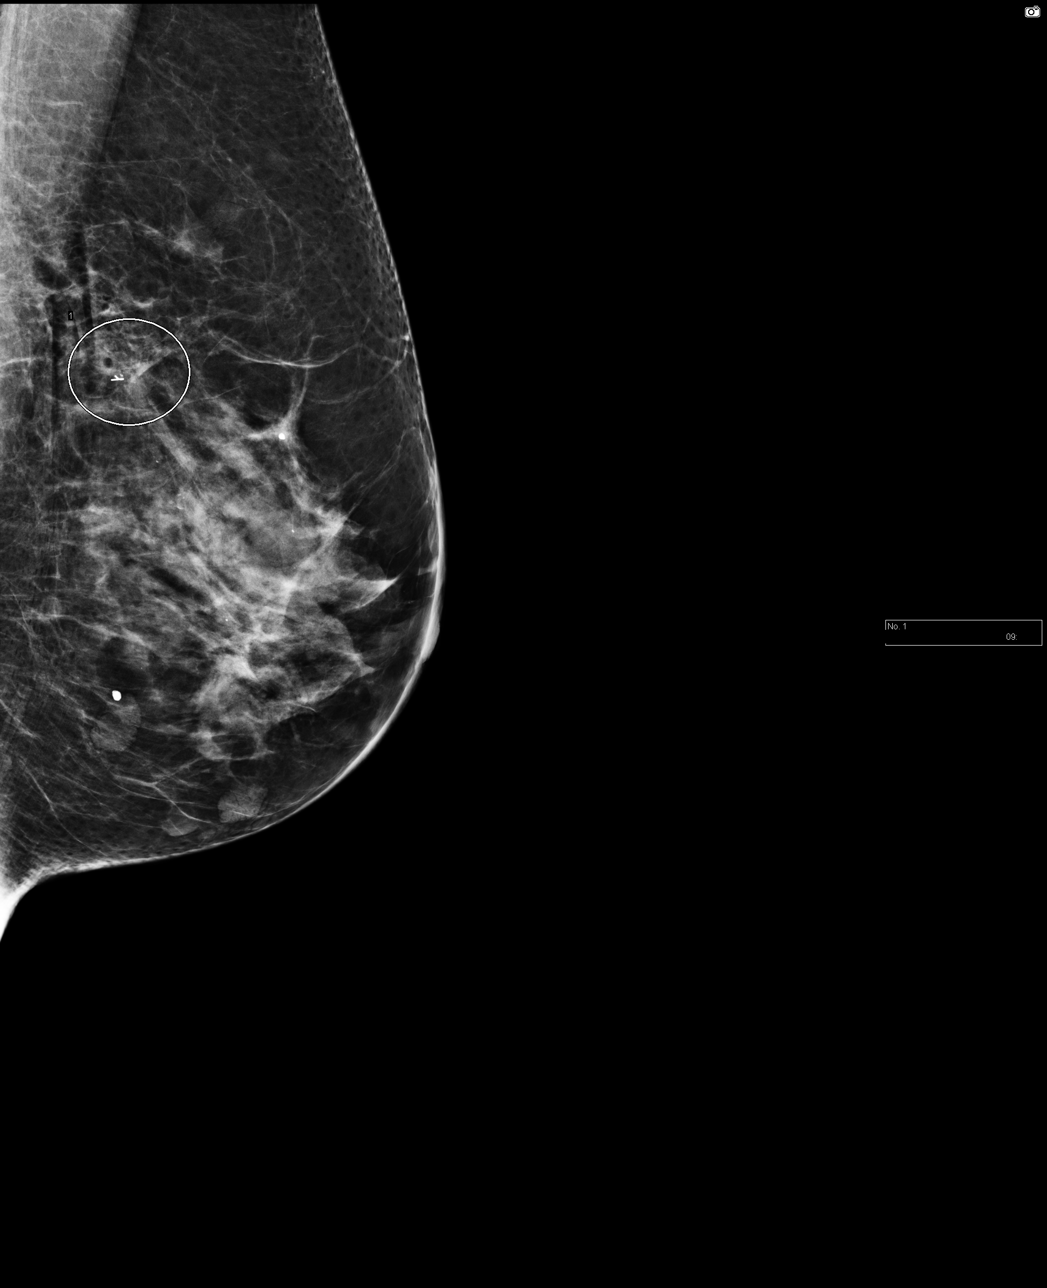

[4 of 4 positions shown; findings below may reference images not displayed]

FINDINGS: Mammographic images were obtained following ultrasound guided biopsy
of a mass in the UPPER OUTER QUADRANT of the LEFT breast at
POSTERIOR depth. The ribbon shaped tissue marker clip migrated
approximately 7 mm anteromedial to the biopsied mass in the UPPER
OUTER QUADRANT.

Expected post biopsy changes are present without evidence of
hematoma.
IMPRESSION: Approximate 7 mm anteromedial migration of the ribbon shaped tissue
marker clip relative to the biopsied mass in the UPPER OUTER
QUADRANT of the LEFT breast at POSTERIOR depth.

Final Assessment: Post Procedure Mammograms for Marker Placement

## 2019-05-20 ENCOUNTER — Telehealth: Payer: Self-pay | Admitting: Oncology

## 2019-05-20 NOTE — Telephone Encounter (Signed)
MD will not be in the office on 09-11-19. Writer phoned patient on this date and rescheduled appt.

## 2019-07-30 ENCOUNTER — Telehealth: Payer: Self-pay | Admitting: Oncology

## 2019-07-30 NOTE — Telephone Encounter (Signed)
Patient had appt scheduled with [facility] for 09-21-19, but MD will not be in the office. Writer phoned patient on this date and rescheduled appt for 09-17-19.

## 2019-09-11 ENCOUNTER — Ambulatory Visit: Payer: Medicare PPO | Admitting: Oncology

## 2019-09-17 ENCOUNTER — Other Ambulatory Visit: Payer: Self-pay | Admitting: *Deleted

## 2019-09-17 ENCOUNTER — Encounter: Payer: Self-pay | Admitting: Oncology

## 2019-09-17 ENCOUNTER — Inpatient Hospital Stay: Payer: Medicare PPO | Attending: Oncology | Admitting: Oncology

## 2019-09-17 ENCOUNTER — Other Ambulatory Visit: Payer: Self-pay

## 2019-09-17 VITALS — BP 139/82 | HR 63 | Temp 98.6°F | Wt 153.2 lb

## 2019-09-17 DIAGNOSIS — C50412 Malignant neoplasm of upper-outer quadrant of left female breast: Secondary | ICD-10-CM | POA: Insufficient documentation

## 2019-09-17 DIAGNOSIS — Z08 Encounter for follow-up examination after completed treatment for malignant neoplasm: Secondary | ICD-10-CM

## 2019-09-17 DIAGNOSIS — Z79899 Other long term (current) drug therapy: Secondary | ICD-10-CM | POA: Diagnosis not present

## 2019-09-17 DIAGNOSIS — Z853 Personal history of malignant neoplasm of breast: Secondary | ICD-10-CM | POA: Diagnosis not present

## 2019-09-17 DIAGNOSIS — M85852 Other specified disorders of bone density and structure, left thigh: Secondary | ICD-10-CM | POA: Diagnosis not present

## 2019-09-17 DIAGNOSIS — Z87891 Personal history of nicotine dependence: Secondary | ICD-10-CM | POA: Diagnosis not present

## 2019-09-17 DIAGNOSIS — Z79811 Long term (current) use of aromatase inhibitors: Secondary | ICD-10-CM

## 2019-09-17 NOTE — Progress Notes (Signed)
Pt states she does not have pan but has pulling tug if she wears any kind of bra besides a sport bra.

## 2019-09-17 NOTE — Progress Notes (Signed)
Hematology/Oncology Consult note [facility]  [phone] [phone]  Patient Care Team: Rusty Aus, MD as PCP - General (Internal Medicine)   Name of the patient: Megan Briggs  [id number]  1950-06-03   Date of visit: 09/17/19  Diagnosis-stage I left breast cancer ER positive  Chief complaint/ Reason for visit-routine follow-up of breast cancer  Heme/Onc history: Patient is a 69 year old seen in the past for normocytic anemia. She is now been referred to me for new diagnosis of breast cancer. Patient had a screening mammogram on 10/16/2017 which showed suspicious mass in the upper outer quadrant of the left breast 6 x 5 x 4 mm in size which was also confirmed on ultrasound. No evidence of left axillary adenopathy. Patient underwent biopsy of the breast mass which showed grade 1, 1 mm invasive mammary carcinoma. ER PR status was not obtained due to the small size of the breast biopsy.  Patient underwent lumpectomy and sentinel lymph node biopsy on 11/14/2017. 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 One sentinel lymph node was negative for malignancy. pT1b pN0.Given that her tumor was less than 1 cm and grade 1 she did not require any Oncotype testing and adjuvant chemotherapy. Patient seen by radiation oncology and completes adjuvant radiation tomorrow  Patient was started on Arimidex in December 2019. Baseline bone density scan showed osteopenia with a score of -1.5. 10-year probability of a major osteoporotic fracture was 15% and hip fracture was 2.5%.Patient reported significant body aches and arthralgias with Arimidex and was switched to letrozole   Interval history-patient is currently tolerating letrozole well without any significant side effects.  She is also taking her calcium and vitamin D.  Occasionally experiences a pulling sensation when she wears a nonsports  bra.  ECOG PS- 0 Pain scale- 0  Review of systems- Review of Systems  Constitutional: Negative for chills, fever, malaise/fatigue and weight loss.  HENT: Negative for congestion, ear discharge and nosebleeds.   Eyes: Negative for blurred vision.  Respiratory: Negative for cough, hemoptysis, sputum production, shortness of breath and wheezing.   Cardiovascular: Negative for chest pain, palpitations, orthopnea and claudication.  Gastrointestinal: Negative for abdominal pain, blood in stool, constipation, diarrhea, heartburn, melena, nausea and vomiting.  Genitourinary: Negative for dysuria, flank pain, frequency, hematuria and urgency.  Musculoskeletal: Negative for back pain, joint pain and myalgias.  Skin: Negative for rash.  Neurological: Negative for dizziness, tingling, focal weakness, seizures, weakness and headaches.  Endo/Heme/Allergies: Does not bruise/bleed easily.  Psychiatric/Behavioral: Negative for depression and suicidal ideas. The patient does not have insomnia.       Allergies  Allergen Reactions  . Ace Inhibitors Anaphylaxis and Swelling    Lisinopril   . Tape Other (See Comments)    Band-aids caused redness and irritation Band-aids caused redness and irritation     Past Medical History:  Diagnosis Date  . Anemia   . Cancer ([place])    Left breast  . Glaucoma   . Hypertension   . Neuropathy   . Personal history of radiation therapy 10/2017   LEFT lumpectomy  . Shoulder dislocation    septemeber 2020     Past Surgical History:  Procedure Laterality Date  . BACK SURGERY    . BREAST BIOPSY Right 1995   benign  . BREAST BIOPSY Left 11/01/2017   US guided biopsy - ribbon clip  . BREAST LUMPECTOMY Left 11/14/2017   INVASIVE MAMMARY CARCINOMA  .  BREAST LUMPECTOMY WITH NEEDLE LOCALIZATION Left 11/14/2017   Procedure: BREAST LUMPECTOMY WITH NEEDLE LOCALIZATION;  Surgeon: Sung Amabile, DO;  Location: [facility] ORS;  Service: General;  Laterality: Left;  .  COLONOSCOPY WITH PROPOFOL    . SENTINEL NODE BIOPSY Left 11/14/2017   Procedure: SENTINEL NODE BIOPSY;  Surgeon: Sung Amabile, DO;  Location: [facility] ORS;  Service: General;  Laterality: Left;    Social History   Socioeconomic History  . Marital status: Married    Spouse name: Not on file  . Number of children: Not on file  . Years of education: Not on file  . Highest education level: Not on file  Occupational History  . Not on file  Tobacco Use  . Smoking status: Former Smoker    Packs/day: 0.25    Quit date: 06/08/1970    Years since quitting: 49.3  . Smokeless tobacco: Never Used  Vaping Use  . Vaping Use: Never used  Substance and Sexual Activity  . Alcohol use: Yes    Alcohol/week: 3.0 standard drinks    Types: 3 Glasses of wine per week    Comment: 1 glass of wine daily.  . Drug use: No  . Sexual activity: Yes  Other Topics Concern  . Not on file  Social History Narrative  . Not on file   Social Determinants of Health   Financial Resource Strain:   . Difficulty of Paying Living Expenses: Not on file  Food Insecurity:   . Worried About Programme researcher, broadcasting/film/video in the Last Year: Not on file  . Ran Out of Food in the Last Year: Not on file  Transportation Needs:   . Lack of Transportation (Medical): Not on file  . Lack of Transportation (Non-Medical): Not on file  Physical Activity:   . Days of Exercise per Week: Not on file  . Minutes of Exercise per Session: Not on file  Stress:   . Feeling of Stress : Not on file  Social Connections:   . Frequency of Communication with Friends and Family: Not on file  . Frequency of Social Gatherings with Friends and Family: Not on file  . Attends Religious Services: Not on file  . Active Member of Clubs or Organizations: Not on file  . Attends Banker Meetings: Not on file  . Marital Status: Not on file  Intimate Partner Violence:   . Fear of Current or Ex-Partner: Not on file  . Emotionally Abused: Not on file    . Physically Abused: Not on file  . Sexually Abused: Not on file    Family History  Problem Relation Age of Onset  . Breast cancer Cousin   . Breast cancer Cousin   . Breast cancer Paternal Aunt      Current Outpatient Medications:  .  bimatoprost (LUMIGAN) 0.01 % SOLN, Place 1 drop into the right eye at bedtime. , Disp: , Rfl:  .  Calcium Carb-Cholecalciferol (CALCIUM-VITAMIN D3) 600-500 MG-UNIT CAPS, Take 2 tablets by mouth., Disp: , Rfl:  .  Cholecalciferol (VITAMIN D) 2000 units tablet, Take 2,000 Units by mouth daily., Disp: , Rfl:  .  gabapentin (NEURONTIN) 300 MG capsule, Take 600 mg by mouth at bedtime. , Disp: , Rfl:  .  ibuprofen (ADVIL,MOTRIN) 800 MG tablet, Take 1 tablet (800 mg total) by mouth every 8 (eight) hours as needed for mild pain or moderate pain., Disp: 30 tablet, Rfl: 0 .  letrozole (FEMARA) 2.5 MG tablet, Take 1 tablet (2.5 mg total)  by mouth daily., Disp: 90 tablet, Rfl: 3 .  loratadine (CLARITIN) 10 MG tablet, Take 10 mg by mouth daily., Disp: , Rfl:  .  meloxicam (MOBIC) 7.5 MG tablet, Take 7.5 mg by mouth daily. , Disp: , Rfl:  .  olmesartan-hydrochlorothiazide (BENICAR HCT) 20-12.5 MG tablet, Take 0.5 tablets by mouth daily., Disp: , Rfl:  .  pantoprazole (PROTONIX) 40 MG tablet, Take 40 mg by mouth daily. , Disp: , Rfl:  .  sennosides-docusate sodium (SENOKOT-S) 8.6-50 MG tablet, Take 2 tablets by mouth at bedtime., Disp: , Rfl:  .  timolol (BETIMOL) 0.5 % ophthalmic solution, Place 1 drop into both eyes 2 (two) times daily., Disp: , Rfl:  .  Turmeric 500 MG CAPS, Take 1 capsule by mouth daily., Disp: , Rfl:  .  vitamin B-12 (CYANOCOBALAMIN) 1000 MCG tablet, Take 1,000 mcg by mouth daily. , Disp: , Rfl:  .  azelastine (ASTELIN) 0.1 % nasal spray, Place 1 spray into both nostrils 2 (two) times daily. Use in each nostril as directed (Patient not taking: Reported on 09/17/2019), Disp: , Rfl:  .  HYDROcodone-acetaminophen (NORCO) 5-325 MG tablet, Take 1 tablet  by mouth every 4 (four) hours as needed for moderate pain. (Patient not taking: Reported on 09/17/2019), Disp: 10 tablet, Rfl: 0  Physical exam:  Vitals:   09/17/19 1151  BP: 139/82  Pulse: 63  Temp: 98.6 F (37 C)  TempSrc: Tympanic  SpO2: 97%  Weight: 153 lb 3.2 oz (69.5 kg)   Physical Exam Constitutional:      General: She is not in acute distress. Pulmonary:     Effort: Pulmonary effort is normal.  Skin:    General: Skin is warm and dry.  Neurological:     Mental Status: She is alert and oriented to person, place, and time.    Breast: Breast exam was performed in seated and lying down position. Patient is status post left lumpectomy with a well-healed surgical scar. No evidence of any palpable masses. No evidence of axillary adenopathy. No evidence of any palpable masses or lumps in the right breast. No evidence of right axillary adenopathy   CMP Latest Ref Rng & Units 03/10/2018  Glucose 70 - 99 mg/dL 116(H)  BUN 8 - 23 mg/dL 30(H)  Creatinine 0.44 - 1.00 mg/dL 1.30(H)  Sodium 135 - 145 mmol/L 138  Potassium 3.5 - 5.1 mmol/L 4.2  Chloride 98 - 111 mmol/L 104  CO2 22 - 32 mmol/L 27  Calcium 8.9 - 10.3 mg/dL 9.2  Total Protein 6.5 - 8.1 g/dL 7.3  Total Bilirubin 0.3 - 1.2 mg/dL 0.6  Alkaline Phos 38 - 126 U/L 67  AST 15 - 41 U/L 26  ALT 0 - 44 U/L 24   CBC Latest Ref Rng & Units 01/06/2018  WBC 4.0 - 10.5 K/uL 3.4(L)  Hemoglobin 12.0 - 15.0 g/dL 10.3(L)  Hematocrit 36 - 46 % 32.9(L)  Platelets 150 - 400 K/uL 211    Assessment and plan- Patient is a 69 y.o. female with pathologic prognostic stage Ia invasive mammary carcinoma of the left breast grade [id number] ER PR positive HER-2/neu negative status post lumpectomy and adjuvant radiation.    This is a routine follow-up visit of breast cancer  Clinically patient is doing well with no concerning signs and symptoms of recurrence based on today's exam.  She will continue taking letrozole for 5 years along with calcium  and vitamin D.Patient is due for mammogram in October 2021 which we will  schedule.  I will also get a bone density scan prior to her next visit. Last bone density scan in February 2020 showed evidence of osteopenia.   Visit Diagnosis 1. Encounter for follow-up surveillance of breast cancer   2. Use of letrozole (Femara)      Dr. Randa Evens, MD, MPH [facility] [phone] 09/17/2019 4:23 PM

## 2019-09-21 ENCOUNTER — Ambulatory Visit: Payer: Medicare PPO | Admitting: Oncology

## 2019-10-08 DIAGNOSIS — E039 Hypothyroidism, unspecified: Secondary | ICD-10-CM | POA: Insufficient documentation

## 2019-11-16 ENCOUNTER — Ambulatory Visit
Admission: RE | Admit: 2019-11-16 | Discharge: 2019-11-16 | Disposition: A | Discharge status: Home / Self Care | Payer: Medicare PPO | Source: Ambulatory Visit | Attending: Oncology | Admitting: Oncology

## 2019-11-16 ENCOUNTER — Other Ambulatory Visit: Payer: Self-pay

## 2019-11-16 DIAGNOSIS — C50412 Malignant neoplasm of upper-outer quadrant of left female breast: Secondary | ICD-10-CM | POA: Diagnosis not present

## 2019-12-09 ENCOUNTER — Other Ambulatory Visit: Payer: Self-pay | Admitting: Oncology

## 2019-12-09 DIAGNOSIS — Z08 Encounter for follow-up examination after completed treatment for malignant neoplasm: Secondary | ICD-10-CM

## 2019-12-09 DIAGNOSIS — Z853 Personal history of malignant neoplasm of breast: Secondary | ICD-10-CM

## 2020-02-15 ENCOUNTER — Telehealth: Payer: Self-pay | Admitting: Radiation Oncology

## 2020-02-15 ENCOUNTER — Ambulatory Visit: Payer: Medicare PPO | Admitting: Radiation Oncology

## 2020-02-15 NOTE — Telephone Encounter (Signed)
Pt. Called to cancel her appt today (1/17) due to weather. Has requested a call back to reschedule.

## 2020-03-07 ENCOUNTER — Other Ambulatory Visit: Payer: Self-pay

## 2020-03-07 ENCOUNTER — Ambulatory Visit
Admission: RE | Admit: 2020-03-07 | Discharge: 2020-03-07 | Disposition: A | Discharge status: Home / Self Care | Payer: Medicare PPO | Source: Ambulatory Visit | Attending: Radiation Oncology | Admitting: Radiation Oncology

## 2020-03-07 ENCOUNTER — Encounter: Payer: Self-pay | Admitting: Radiation Oncology

## 2020-03-07 ENCOUNTER — Ambulatory Visit
Admission: RE | Admit: 2020-03-07 | Discharge: 2020-03-07 | Disposition: A | Discharge status: Home / Self Care | Payer: Medicare PPO | Source: Ambulatory Visit | Attending: Oncology | Admitting: Oncology

## 2020-03-07 VITALS — BP 160/81 | HR 64 | Temp 96.2°F | Wt 154.6 lb

## 2020-03-07 DIAGNOSIS — Z170 Estrogen receptor positive status [ER+]: Secondary | ICD-10-CM

## 2020-03-07 DIAGNOSIS — Z79811 Long term (current) use of aromatase inhibitors: Secondary | ICD-10-CM | POA: Insufficient documentation

## 2020-03-07 DIAGNOSIS — Z13820 Encounter for screening for osteoporosis: Secondary | ICD-10-CM | POA: Diagnosis not present

## 2020-03-07 DIAGNOSIS — M85852 Other specified disorders of bone density and structure, left thigh: Secondary | ICD-10-CM

## 2020-03-07 DIAGNOSIS — C50412 Malignant neoplasm of upper-outer quadrant of left female breast: Secondary | ICD-10-CM

## 2020-03-07 DIAGNOSIS — M8589 Other specified disorders of bone density and structure, multiple sites: Secondary | ICD-10-CM | POA: Insufficient documentation

## 2020-03-07 NOTE — Progress Notes (Signed)
Radiation Oncology Follow up Note  Name: Megan Briggs   Date:   03/07/2020 MRN:  [id number] DOB: 1950-10-12    This 70 y.o. female presents to the clinic today for 2-year follow-up status post whole breast radiation to her left breast for stage I ER/PR positive invasive mammary carcinoma.  REFERRING PROVIDER: Rusty Aus, MD  HPI: Patient is a 70 year old female now out 2 years having completed whole breast radiation to her left breast for stage I ER/PR positive invasive mammary carcinoma.  Seen today in routine follow-up she is doing well specifically denies breast tenderness cough or bone pain.  She had mammograms back in October.  Which were BI-RADS 2 benign which I have reviewed.  She is currently on letrozole tolerating it well without side effect.  COMPLICATIONS OF TREATMENT: none  FOLLOW UP COMPLIANCE: keeps appointments   PHYSICAL EXAM:  BP (!) 160/81   Pulse 64   Temp (!) 96.2 F (35.7 C) (Tympanic)   Wt 154 lb 9 oz (70.1 kg)   BMI 25.72 kg/m  Lungs are clear to A&P cardiac examination essentially unremarkable with regular rate and rhythm. No dominant mass or nodularity is noted in either breast in 2 positions examined. Incision is well-healed. No axillary or supraclavicular adenopathy is appreciated. Cosmetic result is excellent.  Well-developed well-nourished patient in NAD. HEENT reveals PERLA, EOMI, discs not visualized.  Oral cavity is clear. No oral mucosal lesions are identified. Neck is clear without evidence of cervical or supraclavicular adenopathy. Lungs are clear to A&P. Cardiac examination is essentially unremarkable with regular rate and rhythm without murmur rub or thrill. Abdomen is benign with no organomegaly or masses noted. Motor sensory and DTR levels are equal and symmetric in the upper and lower extremities. Cranial nerves II through XII are grossly intact. Proprioception is intact. No peripheral adenopathy or edema is identified. No motor or sensory levels  are noted. Crude visual fields are within normal range.  RADIOLOGY RESULTS: Mammograms reviewed compatible with above-stated findings  PLAN: Present time patient is doing well with no evidence of disease 2 years out from whole breast radiation I am pleased with her overall progress.  She continues on letrozole without side effect.  I have asked to see her back in 1 year for follow-up.  Patient knows to call with any concerns.  I would like to take this opportunity to thank you for allowing me to participate in the care of your patient.Noreene Filbert, MD

## 2020-03-18 ENCOUNTER — Encounter: Payer: Self-pay | Admitting: Oncology

## 2020-03-18 ENCOUNTER — Inpatient Hospital Stay: Payer: Medicare PPO | Attending: Oncology | Admitting: Oncology

## 2020-03-18 VITALS — BP 128/73 | HR 61 | Temp 98.2°F | Resp 16 | Wt 154.0 lb

## 2020-03-18 DIAGNOSIS — Z853 Personal history of malignant neoplasm of breast: Secondary | ICD-10-CM | POA: Diagnosis not present

## 2020-03-18 DIAGNOSIS — C50412 Malignant neoplasm of upper-outer quadrant of left female breast: Secondary | ICD-10-CM | POA: Diagnosis not present

## 2020-03-18 DIAGNOSIS — M85852 Other specified disorders of bone density and structure, left thigh: Secondary | ICD-10-CM | POA: Diagnosis not present

## 2020-03-18 DIAGNOSIS — Z87891 Personal history of nicotine dependence: Secondary | ICD-10-CM | POA: Diagnosis not present

## 2020-03-18 DIAGNOSIS — Z923 Personal history of irradiation: Secondary | ICD-10-CM | POA: Diagnosis not present

## 2020-03-18 DIAGNOSIS — Z08 Encounter for follow-up examination after completed treatment for malignant neoplasm: Secondary | ICD-10-CM | POA: Diagnosis not present

## 2020-03-18 DIAGNOSIS — Z79811 Long term (current) use of aromatase inhibitors: Secondary | ICD-10-CM | POA: Diagnosis not present

## 2020-03-18 DIAGNOSIS — Z170 Estrogen receptor positive status [ER+]: Secondary | ICD-10-CM | POA: Insufficient documentation

## 2020-03-18 NOTE — Progress Notes (Signed)
Hematology/Oncology Consult note [facility]  [phone] [phone]  Patient Care Team: Rusty Aus, MD as PCP - General (Internal Medicine)   Name of the patient: Megan Briggs  [id number]  03-01-1950   Date of visit: 03/18/20  Diagnosis- stage I left breast cancer ER positive  Chief complaint/ Reason for visit-routine follow-up of breast cancer and discuss bone density scan results  Heme/Onc history: Patient is a 70 year old seen in the past for normocytic anemia. She is now been referred to me for new diagnosis of breast cancer. Patient had a screening mammogram on 10/16/2017 which showed suspicious mass in the upper outer quadrant of the left breast 6 x 5 x 4 mm in size which was also confirmed on ultrasound. No evidence of left axillary adenopathy. Patient underwent biopsy of the breast mass which showed grade 1, 1 mm invasive mammary carcinoma. ER PR status was not obtained due to the small size of the breast biopsy.  Patient underwent lumpectomy and sentinel lymph node biopsy on 11/14/2017. 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 One sentinel lymph node was negative for malignancy. pT1b pN0.Given that her tumor was less than 1 cm and grade 1 she did not require any Oncotype testing and adjuvant chemotherapy. Patient seen by radiation oncology and completes adjuvant radiation tomorrow  Patient was started on Arimidex in December 2019. Baseline bone density scan showed osteopenia with a score of -1.5. 10-year probability of a major osteoporotic fracture was 15% and hip fracture was 2.5%.Patient reported significant body aches and arthralgias with Arimidex and was switched to letrozole   Interval history-patient reports tolerating letrozole well without any significant side effects.  Appetite and weight have been normal.  Denies any new breast concerns  ECOG PS-  1 Pain scale- 0   Review of systems- Review of Systems  Constitutional: Negative for chills, fever, malaise/fatigue and weight loss.  HENT: Negative for congestion, ear discharge and nosebleeds.   Eyes: Negative for blurred vision.  Respiratory: Negative for cough, hemoptysis, sputum production, shortness of breath and wheezing.   Cardiovascular: Negative for chest pain, palpitations, orthopnea and claudication.  Gastrointestinal: Negative for abdominal pain, blood in stool, constipation, diarrhea, heartburn, melena, nausea and vomiting.  Genitourinary: Negative for dysuria, flank pain, frequency, hematuria and urgency.  Musculoskeletal: Negative for back pain, joint pain and myalgias.  Skin: Negative for rash.  Neurological: Negative for dizziness, tingling, focal weakness, seizures, weakness and headaches.  Endo/Heme/Allergies: Does not bruise/bleed easily.  Psychiatric/Behavioral: Negative for depression and suicidal ideas. The patient does not have insomnia.       Allergies  Allergen Reactions  . Ace Inhibitors Anaphylaxis and Swelling    Lisinopril   . Tape Other (See Comments)    Band-aids caused redness and irritation Band-aids caused redness and irritation     Past Medical History:  Diagnosis Date  . Anemia   . Cancer ([place])    Left breast  . Glaucoma   . Hypertension   . Neuropathy   . Personal history of radiation therapy 10/2017   LEFT lumpectomy  . Shoulder dislocation    septemeber 2020  . Squamous cell carcinoma of skin 03/09/2014   R med clavicle   . 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 Thyroid disease      Past Surgical History:  Procedure Laterality Date  . BACK SURGERY    .  BREAST BIOPSY Right 1995   benign  . BREAST BIOPSY Left 11/01/2017   US guided biopsy of mass with distortion - ribbon clip, IMC  . BREAST LUMPECTOMY Left 11/14/2017   INVASIVE MAMMARY CARCINOMA, clear margins, negative LN  . BREAST  LUMPECTOMY WITH NEEDLE LOCALIZATION Left 11/14/2017   Procedure: BREAST LUMPECTOMY WITH NEEDLE LOCALIZATION;  Surgeon: Benjamine Sprague, DO;  Location: [facility] ORS;  Service: General;  Laterality: Left;  . COLONOSCOPY WITH PROPOFOL    . SENTINEL NODE BIOPSY Left 11/14/2017   Procedure: SENTINEL NODE BIOPSY;  Surgeon: Benjamine Sprague, DO;  Location: [facility] ORS;  Service: General;  Laterality: Left;    Social History   Socioeconomic History  . Marital status: Married    Spouse name: Not on file  . Number of children: Not on file  . Years of education: Not on file  . Highest education level: Not on file  Occupational History  . Not on file  Tobacco Use  . Smoking status: Former Smoker    Packs/day: 0.25    Quit date: 06/08/1970    Years since quitting: 49.8  . Smokeless tobacco: Never Used  Vaping Use  . Vaping Use: Never used  Substance and Sexual Activity  . Alcohol use: Yes    Alcohol/week: 3.0 standard drinks    Types: 3 Glasses of wine per week    Comment: 1 glass of wine daily.  . Drug use: No  . Sexual activity: Yes  Other Topics Concern  . Not on file  Social History Narrative  . Not on file   Social Determinants of Health   Financial Resource Strain: Not on file  Food Insecurity: Not on file  Transportation Needs: Not on file  Physical Activity: Not on file  Stress: Not on file  Social Connections: Not on file  Intimate Partner Violence: Not on file    Family History  Problem Relation Age of Onset  . Breast cancer Cousin   . Breast cancer Cousin   . Breast cancer Paternal Aunt      Current Outpatient Medications:  .  bimatoprost (LUMIGAN) 0.01 % SOLN, Place 1 drop into both eyes at bedtime., Disp: , Rfl:  .  Cholecalciferol (VITAMIN D) 2000 units tablet, Take 2,000 Units by mouth daily., Disp: , Rfl:  .  gabapentin (NEURONTIN) 300 MG capsule, Take 600 mg by mouth at bedtime. , Disp: , Rfl:  .  ibuprofen (ADVIL,MOTRIN) 800 MG tablet, Take 1 tablet (800 mg total) by  mouth every 8 (eight) hours as needed for mild pain or moderate pain., Disp: 30 tablet, Rfl: 0 .  letrozole (FEMARA) 2.5 MG tablet, TAKE 1 TABLET EVERY DAY, Disp: 90 tablet, Rfl: 3 .  levothyroxine (SYNTHROID) 75 MCG tablet, Take 75 mcg by mouth daily before breakfast., Disp: , Rfl:  .  loratadine (CLARITIN) 10 MG tablet, Take 10 mg by mouth daily., Disp: , Rfl:  .  meloxicam (MOBIC) 7.5 MG tablet, Take 7.5 mg by mouth daily. , Disp: , Rfl:  .  olmesartan-hydrochlorothiazide (BENICAR HCT) 20-12.5 MG tablet, Take 0.5 tablets by mouth daily., Disp: , Rfl:  .  pantoprazole (PROTONIX) 40 MG tablet, Take 40 mg by mouth daily. , Disp: , Rfl:  .  sennosides-docusate sodium (SENOKOT-S) 8.6-50 MG tablet, Take 2 tablets by mouth at bedtime., Disp: , Rfl:  .  timolol (BETIMOL) 0.5 % ophthalmic solution, Place 1 drop into both eyes 2 (two) times daily., Disp: , Rfl:  .  Turmeric 500 MG CAPS,  Take 1 capsule by mouth daily., Disp: , Rfl:  .  vitamin B-12 (CYANOCOBALAMIN) 1000 MCG tablet, Take 1,000 mcg by mouth daily. , Disp: , Rfl:  .  azelastine (ASTELIN) 0.1 % nasal spray, Place 1 spray into both nostrils 2 (two) times daily. Use in each nostril as directed (Patient not taking: No sig reported), Disp: , Rfl:  .  Calcium Carb-Cholecalciferol (CALCIUM-VITAMIN D3) 600-500 MG-UNIT CAPS, Take 2 tablets by mouth. (Patient not taking: Reported on 03/18/2020), Disp: , Rfl:   Physical exam:  Physical Exam Constitutional:      General: She is not in acute distress. Eyes:     Extraocular Movements: EOM normal.  Cardiovascular:     Rate and Rhythm: Normal rate and regular rhythm.     Heart sounds: Normal heart sounds.  Pulmonary:     Effort: Pulmonary effort is normal.     Breath sounds: Normal breath sounds.  Abdominal:     General: Bowel sounds are normal.     Palpations: Abdomen is soft.  Skin:    General: Skin is warm and dry.  Neurological:     Mental Status: She is alert and oriented to person, place,  and time.     Breast exam was performed in seated and lying down position. Patient is status post left lumpectomy with a well-healed surgical scar. No evidence of any palpable masses. No evidence of axillary adenopathy. No evidence of any palpable masses or lumps in the right breast. No evidence of right axillary adenopathy  CMP Latest Ref Rng & Units 03/10/2018  Glucose 70 - 99 mg/dL 116(H)  BUN 8 - 23 mg/dL 30(H)  Creatinine 0.44 - 1.00 mg/dL 1.30(H)  Sodium 135 - 145 mmol/L 138  Potassium 3.5 - 5.1 mmol/L 4.2  Chloride 98 - 111 mmol/L 104  CO2 22 - 32 mmol/L 27  Calcium 8.9 - 10.3 mg/dL 9.2  Total Protein 6.5 - 8.1 g/dL 7.3  Total Bilirubin 0.3 - 1.2 mg/dL 0.6  Alkaline Phos 38 - 126 U/L 67  AST 15 - 41 U/L 26  ALT 0 - 44 U/L 24   CBC Latest Ref Rng & Units 01/06/2018  WBC 4.0 - 10.5 K/uL 3.4(L)  Hemoglobin 12.0 - 15.0 g/dL 10.3(L)  Hematocrit 36.0 - 46.0 % 32.9(L)  Platelets 150 - 400 K/uL 211    No images are attached to the encounter.  DG Bone Density  Result Date: 03/07/2020 EXAM: DUAL X-RAY ABSORPTIOMETRY (DXA) FOR BONE MINERAL DENSITY IMPRESSION: Your patient Megan Briggs completed a BMD test on 03/07/2020 using the [place] (software version: 14.10) manufactured by [employer]. The following summarizes the results of our evaluation. Technologist: PATIENT BIOGRAPHICAL: Name: Megan, Briggs Patient ID: [id number] Birth Date: 1950/06/18 Height: 65.0 in. Gender: Female Exam Date: 03/07/2020 Weight: 153.2 lbs. Indications: Breast CA, Family Hist. (Parent hip fracture), History of Spinal Surgery, Parent Hip Fracture, Postmenopausal Fractures: Treatments: Letrozole, Protonix, Vitamin D DENSITOMETRY RESULTS: Site          Region        Measured Date Measured Age WHO Classification Young Adult T-score BMD         %Change vs. Previous Significant Change (*) AP Spine L1-L4 (L2,L3) 03/07/2020 71.5 Normal 0.6 1.260 g/cm2 2.2% - AP Spine L1-L4 (L2,L3) 03/05/2018  69.5 Normal 0.4 1.233 g/cm2 - - DualFemur Neck Left 03/07/2020 71.5 Osteopenia -1.7 0.808 g/cm2 -3.2% - DualFemur Neck Left 03/05/2018 69.5 Osteopenia -1.5 0.835 g/cm2 - - DualFemur Total  Mean 03/07/2020 71.5 Osteopenia -1.1 0.863 g/cm2 -3.1% Yes DualFemur Total Mean 03/05/2018 69.5 Normal -0.9 0.891 g/cm2 - - Right Forearm Radius 33% 03/07/2020 71.5 Osteopenia -1.3 0.763 g/cm2 -7.3% Yes Right Forearm Radius 33% 03/05/2018 69.5 Normal -0.6 0.823 g/cm2 - - ASSESSMENT: The BMD measured at Femur Neck Left is 0.808 g/cm2 with a T-score of -1.7. This patient is considered osteopenic according to [facility]) criteria. The scan quality is good. L-2 and L-3 were excluded due to degenerative changes. Compared with prior study, there has been no significant change in the spine, but there has been significant decrease in BMD of the total hip. World Pharmacologist [facility]) criteria for post-menopausal, Caucasian Women: Normal:                   T-score at or above -1 SD Osteopenia/low bone mass: T-score between -1 and -2.5 SD Osteoporosis:             T-score at or below -2.5 SD RECOMMENDATIONS: 1. All patients should optimize calcium and vitamin D intake. 2. Consider FDA-approved medical therapies in postmenopausal women and men aged 4 years and older, based on the following: a. A hip or vertebral(clinical or morphometric) fracture b. T-score < -2.5 at the femoral neck or spine after appropriate evaluation to exclude secondary causes c. Low bone mass (T-score between -1.0 and -2.5 at the femoral neck or spine) and a 10-year probability of a hip fracture > 3% or a 10-year probability of a major osteoporosis-related fracture > 20% based on the [place]-adapted WHO algorithm 3. Clinician judgment and/or patient preferences may indicate treatment for people with 10-year fracture probabilities above or below these levels FOLLOW-UP: People with diagnosed cases of osteoporosis or at high risk for fracture should have  regular bone mineral density tests. For patients eligible for Medicare, routine testing is allowed once every 2 years. The testing frequency can be increased to one year for patients who have rapidly progressing disease, those who are receiving or discontinuing medical therapy to restore bone mass, or have additional risk factors. FRAX* RESULTS:  (version: 3.5) 10-year Probability of Fracture1 Major Osteoporotic Fracture2 Hip Fracture 17.2% 5.0% Population: [place] (Caucasian) Risk Factors: Family Hist. (Parent hip fracture) Based on Femur (Left) Neck BMD 1 -The 10-year probability of fracture may be lower than reported if the patient has received treatment. 2 -Major Osteoporotic Fracture: Clinical Spine, Forearm, Hip or Shoulder *FRAX is a Materials engineer of the [employer] for Metabolic Bone Disease, a [place] (WHO) [facility]. ASSESSMENT: The probability of a major osteoporotic fracture is 17.2% within the next ten years. The probability of a hip fracture is 5.0% within the next ten years. Electronically Signed   By: Marlaine Hind M.D.   On: 03/07/2020 11:33     Assessment and plan- Patient is a 70 y.o. female  with pathologic prognostic stage Ia invasive mammary carcinoma of the left breast grade [id number] ER PR positive HER-2/neu negative status post lumpectomy and adjuvant radiation.  She is currently on letrozole and this is a routine follow-up visit  I discussed the results of the bone density scan Which showed worsening of left femur neck osteopenia.  Her score in 2020 was -1.5 and now it is -1.7.  Her 10-year probability of major osteoporotic fracture went up from 2.5% to 5% and overall risk of any fracture went up from 15% to 17.2%.  When the 10-year probability of a hip fracture is greater  than 3% osteopenia is considered significant and may warrant the use of bisphosphonates.  We discussed various options moving forward at this time  Option  1: Staying with letrozole but adding bisphosphonate such as Fosamax or Reclast.  Discussed risks and benefits of weekly Fosamax poorly including all but not limited to GERD.  Discussed risks and benefits of Reclast including all but not limited to fatigue, hypocalcemia and possible risk of osteonecrosis of the jaw and need for dental clearance prior to starting Reclast.  Option 2: Switching from letrozole to tamoxifen.  Discussed risks and benefits of tamoxifen including all but not limited to increased risk of cataracts, uterine cancer and blood clots  Patient however prefers to not continue with letrozole at all at this time.  She does not wish to switch to tamoxifen or add bisphosphonates at this time.  Patient understands that her overall breast cancer recurrence risk is small given that her primary tumor was 6 mm and she has undergone surgery and radiation treatment.  Per the predict tool her overall survival without any hormone therapy would be 80% and with hormone therapy would be 81%.  I will see her back in 6 months with no labs and patient will send [place] a message if she changes her mind about the use of letrozole   Visit Diagnosis 1. Osteopenia of neck of left femur   2. Encounter for follow-up surveillance of breast cancer   3. Use of letrozole (Femara)      Dr. Randa Evens, MD, MPH [facility] at [facility] [phone] 03/18/2020 12:29 PM

## 2020-03-31 ENCOUNTER — Other Ambulatory Visit: Payer: Self-pay

## 2020-03-31 ENCOUNTER — Encounter: Payer: Self-pay | Admitting: Dermatology

## 2020-03-31 ENCOUNTER — Ambulatory Visit: Payer: Medicare PPO | Admitting: Dermatology

## 2020-03-31 DIAGNOSIS — L814 Other melanin hyperpigmentation: Secondary | ICD-10-CM | POA: Diagnosis not present

## 2020-03-31 DIAGNOSIS — L578 Other skin changes due to chronic exposure to nonionizing radiation: Secondary | ICD-10-CM

## 2020-03-31 DIAGNOSIS — L570 Actinic keratosis: Secondary | ICD-10-CM | POA: Diagnosis not present

## 2020-03-31 DIAGNOSIS — Z85828 Personal history of other malignant neoplasm of skin: Secondary | ICD-10-CM | POA: Diagnosis not present

## 2020-03-31 DIAGNOSIS — Z1283 Encounter for screening for malignant neoplasm of skin: Secondary | ICD-10-CM | POA: Diagnosis not present

## 2020-03-31 DIAGNOSIS — D1800 Hemangioma unspecified site: Secondary | ICD-10-CM

## 2020-03-31 DIAGNOSIS — L820 Inflamed seborrheic keratosis: Secondary | ICD-10-CM | POA: Diagnosis not present

## 2020-03-31 DIAGNOSIS — L821 Other seborrheic keratosis: Secondary | ICD-10-CM

## 2020-03-31 DIAGNOSIS — D229 Melanocytic nevi, unspecified: Secondary | ICD-10-CM

## 2020-03-31 NOTE — Patient Instructions (Signed)
Cryotherapy Aftercare  . Wash gently with soap and water everyday.   . Apply Vaseline and Band-Aid daily until healed.  

## 2020-03-31 NOTE — Progress Notes (Signed)
Follow-Up Visit   Subjective  Megan Briggs is a 70 y.o. female who presents for the following: Annual Exam (Mole check ). Pt concerned about irritated growths on her chest, neck, shoulder and arm. The patient presents for Total-Body Skin Exam (TBSE) for skin cancer screening and mole check.  The following portions of the chart were reviewed this encounter and updated as appropriate:   Tobacco  Allergies  Meds  Problems  Med Hx  Surg Hx  Fam Hx     Review of Systems:  No other skin or systemic complaints except as noted in HPI or Assessment and Plan.  Objective  Well appearing patient in no apparent distress; mood and affect are within normal limits.  A full examination was performed including scalp, head, eyes, ears, nose, lips, neck, chest, axillae, abdomen, back, buttocks, bilateral upper extremities, bilateral lower extremities, hands, feet, fingers, toes, fingernails, and toenails. All findings within normal limits unless otherwise noted below.  Objective  L forehead x 2, upper lip x 2, glabella x 1 (5): Erythematous thin papules/macules with gritty scale.   Objective  Left Shoulder - Anterior: Erythematous keratotic or waxy stuck-on papule or plaque.    Assessment & Plan  AK (actinic keratosis) (5) L forehead x 2, upper lip x 2, glabella x 1  If not gone in 6 weeks return to the office   Destruction of lesion - L forehead x 2, upper lip x 2, glabella x 1 Complexity: simple   Destruction method: cryotherapy   Informed consent: discussed and consent obtained   Timeout:  patient name, date of birth, surgical site, and procedure verified Lesion destroyed using liquid nitrogen: Yes   Region frozen until ice ball extended beyond lesion: Yes   Outcome: patient tolerated procedure well with no complications   Post-procedure details: wound care instructions given    Inflamed seborrheic keratosis Left Shoulder - Anterior  Destruction of lesion - Left Shoulder -  Anterior Complexity: simple   Destruction method: cryotherapy   Informed consent: discussed and consent obtained   Timeout:  patient name, date of birth, surgical site, and procedure verified Lesion destroyed using liquid nitrogen: Yes   Region frozen until ice ball extended beyond lesion: Yes   Outcome: patient tolerated procedure well with no complications   Post-procedure details: wound care instructions given    Skin cancer screening  Lentigines - Scattered tan macules - Due to sun exposure - Benign-appering, observe - Recommend daily broad spectrum sunscreen SPF 30+ to sun-exposed areas, reapply every 2 hours as needed. - Call for any changes  Seborrheic Keratoses - Stuck-on, waxy, tan-brown papules and plaques  - Discussed benign etiology and prognosis. - Observe - Call for any changes  Melanocytic Nevi - Tan-brown and/or pink-flesh-colored symmetric macules and papules - Benign appearing on exam today - Observation - Call clinic for new or changing moles - Recommend daily use of broad spectrum spf 30+ sunscreen to sun-exposed areas.   Hemangiomas - Red papules - Discussed benign nature - Observe - Call for any changes  Actinic Damage - Chronic, secondary to cumulative UV/sun exposure - diffuse scaly erythematous macules with underlying dyspigmentation - Recommend daily broad spectrum sunscreen SPF 30+ to sun-exposed areas, reapply every 2 hours as needed.  - Call for new or changing lesions.  Skin cancer screening performed today.  History of Squamous Cell Carcinoma of the Skin - No evidence of recurrence today - No lymphadenopathy - Recommend regular full body skin exams - Recommend  daily broad spectrum sunscreen SPF 30+ to sun-exposed areas, reapply every 2 hours as needed.  - Call if any new or changing lesions are noted between office visits  Return in about 1 year (around 03/31/2021).  IMarye Round, CMA, am acting as scribe for Sarina Ser, MD  .  Documentation: I have reviewed the above documentation for accuracy and completeness, and I agree with the above.  Sarina Ser, MD

## 2020-08-18 ENCOUNTER — Telehealth: Payer: Self-pay | Admitting: Oncology

## 2020-08-18 NOTE — Telephone Encounter (Signed)
Left VM with patient and requested she please call back to reschedule her appt on 8/18 due to Dr. Janese Banks not being in clinic on thursdays any longer (hope to r/s to 8/2 depending on her time preference).

## 2020-08-31 ENCOUNTER — Telehealth: Payer: Self-pay | Admitting: Oncology

## 2020-08-31 NOTE — Telephone Encounter (Signed)
Left VM with patient to make her aware of new appointment time. Patient was previously scheduled on a Thursday and Dr. Janese Banks is no longer in the office on that day. Mailing appt reminder also.

## 2020-09-12 ENCOUNTER — Ambulatory Visit: Payer: Medicare PPO | Admitting: Radiation Oncology

## 2020-09-15 ENCOUNTER — Ambulatory Visit: Payer: Medicare PPO | Admitting: Oncology

## 2020-09-30 ENCOUNTER — Encounter: Payer: Self-pay | Admitting: Oncology

## 2020-09-30 ENCOUNTER — Inpatient Hospital Stay: Payer: Medicare PPO | Attending: Oncology | Admitting: Oncology

## 2020-09-30 VITALS — BP 143/87 | HR 58 | Temp 97.2°F | Resp 18 | Wt 155.3 lb

## 2020-09-30 DIAGNOSIS — Z923 Personal history of irradiation: Secondary | ICD-10-CM | POA: Diagnosis not present

## 2020-09-30 DIAGNOSIS — Z170 Estrogen receptor positive status [ER+]: Secondary | ICD-10-CM | POA: Insufficient documentation

## 2020-09-30 DIAGNOSIS — Z08 Encounter for follow-up examination after completed treatment for malignant neoplasm: Secondary | ICD-10-CM

## 2020-09-30 DIAGNOSIS — Z853 Personal history of malignant neoplasm of breast: Secondary | ICD-10-CM

## 2020-09-30 DIAGNOSIS — C50412 Malignant neoplasm of upper-outer quadrant of left female breast: Secondary | ICD-10-CM | POA: Insufficient documentation

## 2020-09-30 DIAGNOSIS — M8580 Other specified disorders of bone density and structure, unspecified site: Secondary | ICD-10-CM | POA: Diagnosis not present

## 2020-10-02 NOTE — Progress Notes (Signed)
Hematology/Oncology Consult note [facility]  Telephone:([phone] Fax:([phone]  Patient Care Team: Rusty Aus, MD as PCP - General (Internal Medicine)   Name of the patient: Megan Briggs  [id number]  December 12, 1950   Date of visit: 10/02/20  Diagnosis- stage I left breast cancer ER positive    Chief complaint/ Reason for visit-routine follow-up of breast cancer  Heme/Onc history: Patient is a 70 year old seen in the past for normocytic anemia.  She is now been referred to me for new diagnosis of breast cancer.  Patient had a screening mammogram on 10/16/2017 which showed suspicious mass in the upper outer quadrant of the left breast 6 x 5 x 4 mm in size which was also confirmed on ultrasound.  No evidence of left axillary adenopathy.  Patient underwent biopsy of the breast mass which showed grade 1, 1 mm invasive mammary carcinoma.  ER PR status was not obtained due to the small size of the breast biopsy.   Patient underwent lumpectomy and sentinel lymph node biopsy on 11/14/2017.  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  One sentinel lymph node was negative for malignancy.  pT1b pN0.  Given that her tumor was less than 1 cm and grade 1 she did not require any Oncotype testing and adjuvant chemotherapy.  Patient seen by radiation oncology and completes adjuvant radiation tomorrow   Patient was started on Arimidex in December 2019.  Baseline bone density scan showed osteopenia with a score of -1.5.  10-year probability of a major osteoporotic fracture was 15% and hip fracture was 2.5%.  Patient reported significant body aches and arthralgias with Arimidex and was switched to letrozole.  Patient was noted to have worsening osteopenia on her bone density scan in February 2022 when patient decided to stop aromatase inhibitors altogether at that time.  She did not wish to switch to tamoxifen either.  Interval  history-patient presently reports doing well overall.  Denies any breast concerns at this time.  Reports that her her energy levels have actually improved after stopping letrozole.  ECOG PS- 1 Pain scale- 0   Review of systems- Review of Systems  Constitutional:  Negative for chills, fever, malaise/fatigue and weight loss.  HENT:  Negative for congestion, ear discharge and nosebleeds.   Eyes:  Negative for blurred vision.  Respiratory:  Negative for cough, hemoptysis, sputum production, shortness of breath and wheezing.   Cardiovascular:  Negative for chest pain, palpitations, orthopnea and claudication.  Gastrointestinal:  Negative for abdominal pain, blood in stool, constipation, diarrhea, heartburn, melena, nausea and vomiting.  Genitourinary:  Negative for dysuria, flank pain, frequency, hematuria and urgency.  Musculoskeletal:  Negative for back pain, joint pain and myalgias.  Skin:  Negative for rash.  Neurological:  Negative for dizziness, tingling, focal weakness, seizures, weakness and headaches.  Endo/Heme/Allergies:  Does not bruise/bleed easily.  Psychiatric/Behavioral:  Negative for depression and suicidal ideas. The patient does not have insomnia.       Allergies  Allergen Reactions   Ace Inhibitors Anaphylaxis and Swelling    Lisinopril    Tape Other (See Comments)    Band-aids caused redness and irritation Band-aids caused redness and irritation     Past Medical History:  Diagnosis Date   Anemia    Cancer ([place])    Left breast   Glaucoma    Hypertension    Neuropathy    Personal history of radiation therapy 10/2017  LEFT lumpectomy   Shoulder dislocation    septemeber 2020   Squamous cell carcinoma of skin 03/09/2014   R med clavicle    Squamous cell carcinoma of skin 02/11/2014   R chest parasternal - SCCIS    Thyroid disease      Past Surgical History:  Procedure Laterality Date   BACK SURGERY     BREAST BIOPSY Right 1995   benign   BREAST  BIOPSY Left 11/01/2017   US guided biopsy of mass with distortion - ribbon clip, [facility]   BREAST LUMPECTOMY Left 11/14/2017   INVASIVE MAMMARY CARCINOMA, clear margins, negative LN   BREAST LUMPECTOMY WITH NEEDLE LOCALIZATION Left 11/14/2017   Procedure: BREAST LUMPECTOMY WITH NEEDLE LOCALIZATION;  Surgeon: Benjamine Sprague, DO;  Location: [facility] ORS;  Service: General;  Laterality: Left;   COLONOSCOPY WITH PROPOFOL     SENTINEL NODE BIOPSY Left 11/14/2017   Procedure: SENTINEL NODE BIOPSY;  Surgeon: Benjamine Sprague, DO;  Location: [facility] ORS;  Service: General;  Laterality: Left;    Social History   Socioeconomic History   Marital status: Married    Spouse name: Not on file   Number of children: Not on file   Years of education: Not on file   Highest education level: Not on file  Occupational History   Not on file  Tobacco Use   Smoking status: Former    Packs/day: 0.25    Types: Cigarettes    Quit date: 06/08/1970    Years since quitting: 50.3   Smokeless tobacco: Never  Vaping Use   Vaping Use: Never used  Substance and Sexual Activity   Alcohol use: Yes    Alcohol/week: 3.0 standard drinks    Types: 3 Glasses of wine per week    Comment: 1 glass of wine daily.   Drug use: No   Sexual activity: Yes  Other Topics Concern   Not on file  Social History Narrative   Not on file   Social Determinants of Health   Financial Resource Strain: Not on file  Food Insecurity: Not on file  Transportation Needs: Not on file  Physical Activity: Not on file  Stress: Not on file  Social Connections: Not on file  Intimate Partner Violence: Not on file    Family History  Problem Relation Age of Onset   Breast cancer Cousin    Breast cancer Cousin    Breast cancer Paternal Aunt      Current Outpatient Medications:    bimatoprost (LUMIGAN) 0.01 % SOLN, Place 1 drop into both eyes at bedtime., Disp: , Rfl:    Cholecalciferol (VITAMIN D) 2000 units tablet, Take 2,000 Units by mouth daily.,  Disp: , Rfl:    gabapentin (NEURONTIN) 300 MG capsule, Take 600 mg by mouth at bedtime. , Disp: , Rfl:    ibuprofen (ADVIL,MOTRIN) 800 MG tablet, Take 1 tablet (800 mg total) by mouth every 8 (eight) hours as needed for mild pain or moderate pain., Disp: 30 tablet, Rfl: 0   ketorolac (ACULAR) 0.5 % ophthalmic solution, 1 drop 4 (four) times daily., Disp: , Rfl:    levothyroxine (SYNTHROID) 75 MCG tablet, Take 75 mcg by mouth daily before breakfast., Disp: , Rfl:    loratadine (CLARITIN) 10 MG tablet, Take 10 mg by mouth daily., Disp: , Rfl:    meloxicam (MOBIC) 7.5 MG tablet, Take 7.5 mg by mouth daily. , Disp: , Rfl:    olmesartan-hydrochlorothiazide (BENICAR HCT) 20-12.5 MG tablet, Take 0.5 tablets by mouth daily.,  Disp: , Rfl:    pantoprazole (PROTONIX) 40 MG tablet, Take 40 mg by mouth daily. , Disp: , Rfl:    sennosides-docusate sodium (SENOKOT-S) 8.6-50 MG tablet, Take 2 tablets by mouth at bedtime., Disp: , Rfl:    timolol (BETIMOL) 0.5 % ophthalmic solution, Place 1 drop into both eyes 2 (two) times daily., Disp: , Rfl:    Turmeric 500 MG CAPS, Take 1 capsule by mouth daily., Disp: , Rfl:    vitamin B-12 (CYANOCOBALAMIN) 1000 MCG tablet, Take 1,000 mcg by mouth daily. , Disp: , Rfl:    azelastine (ASTELIN) 0.1 % nasal spray, Place 1 spray into both nostrils 2 (two) times daily. Use in each nostril as directed (Patient not taking: No sig reported), Disp: , Rfl:    Calcium Carb-Cholecalciferol (CALCIUM-VITAMIN D3) 600-500 MG-UNIT CAPS, Take 2 tablets by mouth. (Patient not taking: Reported on 03/18/2020), Disp: , Rfl:    letrozole (FEMARA) 2.5 MG tablet, TAKE 1 TABLET EVERY DAY (Patient not taking: Reported on 09/30/2020), Disp: 90 tablet, Rfl: 3  Physical exam:  Vitals:   09/30/20 1146  BP: (!) 143/87  Pulse: (!) 58  Resp: 18  Temp: (!) 97.2 F (36.2 C)  SpO2: 98%  Weight: 155 lb 4.8 oz (70.4 kg)   Physical Exam Constitutional:      General: She is not in acute  distress. Cardiovascular:     Rate and Rhythm: Normal rate and regular rhythm.     Heart sounds: Normal heart sounds.  Pulmonary:     Effort: Pulmonary effort is normal.     Breath sounds: Normal breath sounds.  Abdominal:     General: Bowel sounds are normal.     Palpations: Abdomen is soft.  Skin:    General: Skin is warm and dry.  Neurological:     Mental Status: She is alert and oriented to person, place, and time.  Breast exam was performed in seated and lying down position. Patient is status post left lumpectomy with a well-healed surgical scar. No evidence of any palpable masses. No evidence of axillary adenopathy. No evidence of any palpable masses or lumps in the right breast. No evidence of right axillary adenopathy    CMP Latest Ref Rng & Units 03/10/2018  Glucose 70 - 99 mg/dL 116(H)  BUN 8 - 23 mg/dL 30(H)  Creatinine 0.44 - 1.00 mg/dL 1.30(H)  Sodium 135 - 145 mmol/L 138  Potassium 3.5 - 5.1 mmol/L 4.2  Chloride 98 - 111 mmol/L 104  CO2 22 - 32 mmol/L 27  Calcium 8.9 - 10.3 mg/dL 9.2  Total Protein 6.5 - 8.1 g/dL 7.3  Total Bilirubin 0.3 - 1.2 mg/dL 0.6  Alkaline Phos 38 - 126 U/L 67  AST 15 - 41 U/L 26  ALT 0 - 44 U/L 24   CBC Latest Ref Rng & Units 01/06/2018  WBC 4.0 - 10.5 K/uL 3.4(L)  Hemoglobin 12.0 - 15.0 g/dL 10.3(L)  Hematocrit 36.0 - 46.0 % 32.9(L)  Platelets 150 - 400 K/uL 211       Assessment and plan- Patient is a 70 y.o. female with pathologic prognostic stage Ia invasive mammary carcinoma of the left breast grade [id number] ER PR positive HER-2/neu negative status post lumpectomy and adjuvant radiation.  She is here for routine follow-up  Clinically patient is doing well with no signs and symptoms of recurrence based on today's exam.  She is not currently taking any endocrine therapy anymore given her worsening osteopenia.  We could consider repeating  her bone density scan again in 2 years time and if her bone density is overall stable,  initiation of bisphosphonates can be potentially delayed.  I will see her back in 6 months for a breast exam   Visit Diagnosis 1. Encounter for follow-up surveillance of breast cancer      Dr. Randa Evens, MD, MPH [facility] at [facility] [phone] 10/02/2020 8:45 PM

## 2020-10-13 DIAGNOSIS — N1831 Chronic kidney disease, stage 3a: Secondary | ICD-10-CM | POA: Insufficient documentation

## 2020-11-16 ENCOUNTER — Ambulatory Visit
Admission: RE | Admit: 2020-11-16 | Discharge: 2020-11-16 | Disposition: A | Discharge status: Home / Self Care | Payer: Medicare PPO | Source: Ambulatory Visit | Attending: Oncology | Admitting: Oncology

## 2020-11-16 ENCOUNTER — Other Ambulatory Visit: Payer: Self-pay

## 2020-11-16 DIAGNOSIS — Z853 Personal history of malignant neoplasm of breast: Secondary | ICD-10-CM | POA: Insufficient documentation

## 2020-11-16 DIAGNOSIS — Z08 Encounter for follow-up examination after completed treatment for malignant neoplasm: Secondary | ICD-10-CM | POA: Insufficient documentation

## 2020-11-16 DIAGNOSIS — Z1231 Encounter for screening mammogram for malignant neoplasm of breast: Secondary | ICD-10-CM | POA: Insufficient documentation

## 2021-03-13 ENCOUNTER — Other Ambulatory Visit: Payer: Self-pay

## 2021-03-13 ENCOUNTER — Ambulatory Visit
Admission: RE | Admit: 2021-03-13 | Discharge: 2021-03-13 | Disposition: A | Discharge status: Home / Self Care | Payer: Medicare PPO | Source: Ambulatory Visit | Attending: Radiation Oncology | Admitting: Radiation Oncology

## 2021-03-13 ENCOUNTER — Encounter: Payer: Self-pay | Admitting: Radiation Oncology

## 2021-03-13 VITALS — BP 159/92 | HR 58 | Temp 98.3°F | Resp 14 | Ht 64.0 in | Wt 157.0 lb

## 2021-03-13 DIAGNOSIS — Z170 Estrogen receptor positive status [ER+]: Secondary | ICD-10-CM

## 2021-03-13 DIAGNOSIS — C50412 Malignant neoplasm of upper-outer quadrant of left female breast: Secondary | ICD-10-CM

## 2021-03-13 NOTE — Progress Notes (Signed)
Radiation Oncology Follow up Note  Name: Megan Briggs   Date:   03/13/2021 MRN:  [id number] DOB: [birth year]    This 71 y.o. female presents to the clinic today for a 3-year follow-up status post whole breast radiation to her left breast for stage I ER/PR positive invasive mammary carcinoma. REFERRING PROVIDER: Rusty Aus, MD  HPI: Patient is a 71 year old female now at over 3 years having completed whole breast radiation to her left breast for stage I ER/PR positive invasive mammary carcinoma.  Seen today in routine follow-up she is doing well specifically denies breast tenderness cough or bone pain..  She had mammograms back in October which I have reviewed were BI-RADS 1 negative.  She is not on aromatase inhibitor.  COMPLICATIONS OF TREATMENT: none  FOLLOW UP COMPLIANCE: keeps appointments   PHYSICAL EXAM:  BP (!) 159/92 (BP Location: Right Arm, Patient Position: Sitting)    Pulse (!) 58    Temp 98.3 F (36.8 C) (Tympanic)    Resp 14    Ht 5\' 4"  (1.626 m)    Wt 157 lb (71.2 kg)    SpO2 100%    BMI 26.95 kg/m  Lungs are clear to A&P cardiac examination essentially unremarkable with regular rate and rhythm. No dominant mass or nodularity is noted in either breast in 2 positions examined. Incision is well-healed. No axillary or supraclavicular adenopathy is appreciated. Cosmetic result is excellent.  Well-developed well-nourished patient in NAD. HEENT reveals PERLA, EOMI, discs not visualized.  Oral cavity is clear. No oral mucosal lesions are identified. Neck is clear without evidence of cervical or supraclavicular adenopathy. Lungs are clear to A&P. Cardiac examination is essentially unremarkable with regular rate and rhythm without murmur rub or thrill. Abdomen is benign with no organomegaly or masses noted. Motor sensory and DTR levels are equal and symmetric in the upper and lower extremities. Cranial nerves II through XII are grossly intact. Proprioception is intact. No peripheral  adenopathy or edema is identified. No motor or sensory levels are noted. Crude visual fields are within normal range.  RADIOLOGY RESULTS: Mammograms reviewed compatible with above-stated findings  PLAN: Present time she is now out over 3 years with no evidence of disease.  I am going to turn follow-up care over to medical oncology.  I be happy to reevaluate patient anytime should further radiation therapy be indicated.  Patient is to call with any concerns.  I would like to take this opportunity to thank you for allowing me to participate in the care of your patient.Noreene Filbert, MD

## 2021-03-31 ENCOUNTER — Encounter: Payer: Self-pay | Admitting: Oncology

## 2021-03-31 ENCOUNTER — Inpatient Hospital Stay: Payer: Medicare PPO | Attending: Oncology | Admitting: Oncology

## 2021-03-31 ENCOUNTER — Other Ambulatory Visit: Payer: Self-pay

## 2021-03-31 VITALS — BP 126/79 | HR 59 | Temp 98.1°F | Resp 17 | Wt 157.7 lb

## 2021-03-31 DIAGNOSIS — Z853 Personal history of malignant neoplasm of breast: Secondary | ICD-10-CM

## 2021-03-31 DIAGNOSIS — M8580 Other specified disorders of bone density and structure, unspecified site: Secondary | ICD-10-CM | POA: Insufficient documentation

## 2021-03-31 DIAGNOSIS — C50412 Malignant neoplasm of upper-outer quadrant of left female breast: Secondary | ICD-10-CM | POA: Insufficient documentation

## 2021-03-31 DIAGNOSIS — Z170 Estrogen receptor positive status [ER+]: Secondary | ICD-10-CM | POA: Insufficient documentation

## 2021-03-31 DIAGNOSIS — Z79899 Other long term (current) drug therapy: Secondary | ICD-10-CM | POA: Diagnosis not present

## 2021-03-31 DIAGNOSIS — Z08 Encounter for follow-up examination after completed treatment for malignant neoplasm: Secondary | ICD-10-CM | POA: Diagnosis not present

## 2021-03-31 NOTE — Progress Notes (Signed)
Breast ca follow up visit. No breast pain or discomfort. Intermittent hot flash sensation when she lies flat in bed at night time. Appetite is good.  ?

## 2021-04-02 NOTE — Progress Notes (Signed)
Hematology/Oncology Consult note [facility]  Telephone:([phone] Fax:([phone]  Patient Care Team: Rusty Aus, MD as PCP - General (Internal Medicine)   Name of the patient: Megan Briggs  [id number]  [birth year]   Date of visit: 04/02/21  Diagnosis-  stage I left breast cancer ER positive  Chief complaint/ Reason for visit- routine f/u of breast cancer  Heme/Onc history: Patient is a 71 year old seen in the past for normocytic anemia.  She is now been referred to me for new diagnosis of breast cancer.  Patient had a screening mammogram on 10/16/2017 which showed suspicious mass in the upper outer quadrant of the left breast 6 x 5 x 4 mm in size which was also confirmed on ultrasound.  No evidence of left axillary adenopathy.    Patient underwent lumpectomy and sentinel lymph node biopsy on 11/14/2017.  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  One sentinel lymph node was negative for malignancy.  pT1b pN0.  Given that her tumor was less than 1 cm and grade 1 she did not require any Oncotype testing and adjuvant chemotherapy.  Patient seen by radiation oncology and completed adjuvant radiation.   Patient was started on Arimidex in December 2019.  Baseline bone density scan showed osteopenia with a score of -1.5.  10-year probability of a major osteoporotic fracture was 15% and hip fracture was 2.5%.  Patient reported significant body aches and arthralgias with Arimidex and was switched to letrozole.  Patient was noted to have worsening osteopenia on her bone density scan in February 2022 when patient decided to stop aromatase inhibitors altogether at that time.  She did not wish to switch to tamoxifen either.  Interval history- doing well and reports no breast concerns at this time. Appetite and weight have remained stable.   ECOG PS- 0 Pain scale- 0   Review of systems- Review of Systems   Constitutional:  Negative for chills, fever, malaise/fatigue and weight loss.  HENT:  Negative for congestion, ear discharge and nosebleeds.   Eyes:  Negative for blurred vision.  Respiratory:  Negative for cough, hemoptysis, sputum production, shortness of breath and wheezing.   Cardiovascular:  Negative for chest pain, palpitations, orthopnea and claudication.  Gastrointestinal:  Negative for abdominal pain, blood in stool, constipation, diarrhea, heartburn, melena, nausea and vomiting.  Genitourinary:  Negative for dysuria, flank pain, frequency, hematuria and urgency.  Musculoskeletal:  Negative for back pain, joint pain and myalgias.  Skin:  Negative for rash.  Neurological:  Negative for dizziness, tingling, focal weakness, seizures, weakness and headaches.  Endo/Heme/Allergies:  Does not bruise/bleed easily.  Psychiatric/Behavioral:  Negative for depression and suicidal ideas. The patient does not have insomnia.      Allergies  Allergen Reactions   Ace Inhibitors Anaphylaxis and Swelling    Lisinopril    Tape Other (See Comments)    Band-aids caused redness and irritation Band-aids caused redness and irritation     Past Medical History:  Diagnosis Date   Anemia    Cancer ([place])    Left breast   Glaucoma    Hypertension    Neuropathy    Personal history of radiation therapy 10/2017   LEFT lumpectomy   Shoulder dislocation    septemeber 2020   Squamous cell carcinoma of skin 03/09/2014   R med clavicle    Squamous cell carcinoma of skin 02/11/2014   R chest parasternal - SCCIS  Thyroid disease      Past Surgical History:  Procedure Laterality Date   BACK SURGERY     BREAST BIOPSY Right 1995   benign   BREAST BIOPSY Left 11/01/2017   US guided biopsy of mass with distortion - ribbon clip, [facility]   BREAST LUMPECTOMY Left 11/14/2017   INVASIVE MAMMARY CARCINOMA, clear margins, negative LN   BREAST LUMPECTOMY WITH NEEDLE LOCALIZATION Left 11/14/2017   Procedure:  BREAST LUMPECTOMY WITH NEEDLE LOCALIZATION;  Surgeon: Benjamine Sprague, DO;  Location: [facility] ORS;  Service: General;  Laterality: Left;   COLONOSCOPY WITH PROPOFOL     SENTINEL NODE BIOPSY Left 11/14/2017   Procedure: SENTINEL NODE BIOPSY;  Surgeon: Benjamine Sprague, DO;  Location: [facility] ORS;  Service: General;  Laterality: Left;    Social History   Socioeconomic History   Marital status: Married    Spouse name: Not on file   Number of children: Not on file   Years of education: Not on file   Highest education level: Not on file  Occupational History   Not on file  Tobacco Use   Smoking status: Former    Packs/day: 0.25    Types: Cigarettes    Quit date: 06/08/1970    Years since quitting: 50.8   Smokeless tobacco: Never  Vaping Use   Vaping Use: Never used  Substance and Sexual Activity   Alcohol use: Yes    Alcohol/week: 3.0 standard drinks    Types: 3 Glasses of wine per week    Comment: 1 glass of wine daily.   Drug use: No   Sexual activity: Yes  Other Topics Concern   Not on file  Social History Narrative   Not on file   Social Determinants of Health   Financial Resource Strain: Not on file  Food Insecurity: Not on file  Transportation Needs: Not on file  Physical Activity: Not on file  Stress: Not on file  Social Connections: Not on file  Intimate Partner Violence: Not on file    Family History  Problem Relation Age of Onset   Breast cancer Cousin    Breast cancer Cousin    Breast cancer Paternal Aunt      Current Outpatient Medications:    bimatoprost (LUMIGAN) 0.01 % SOLN, Place 1 drop into both eyes at bedtime., Disp: , Rfl:    Cholecalciferol (VITAMIN D) 2000 units tablet, Take 2,000 Units by mouth daily., Disp: , Rfl:    gabapentin (NEURONTIN) 300 MG capsule, Take 600 mg by mouth at bedtime. , Disp: , Rfl:    ibuprofen (ADVIL,MOTRIN) 800 MG tablet, Take 1 tablet (800 mg total) by mouth every 8 (eight) hours as needed for mild pain or moderate pain., Disp:  30 tablet, Rfl: 0   levothyroxine (SYNTHROID) 75 MCG tablet, Take 75 mcg by mouth daily before breakfast., Disp: , Rfl:    loratadine (CLARITIN) 10 MG tablet, Take 10 mg by mouth daily., Disp: , Rfl:    meloxicam (MOBIC) 7.5 MG tablet, Take 7.5 mg by mouth daily. , Disp: , Rfl:    olmesartan-hydrochlorothiazide (BENICAR HCT) 20-12.5 MG tablet, Take 0.5 tablets by mouth daily., Disp: , Rfl:    pantoprazole (PROTONIX) 40 MG tablet, Take 40 mg by mouth daily. , Disp: , Rfl:    sennosides-docusate sodium (SENOKOT-S) 8.6-50 MG tablet, Take 2 tablets by mouth at bedtime., Disp: , Rfl:    timolol (BETIMOL) 0.5 % ophthalmic solution, Place 1 drop into both eyes 2 (two) times daily., Disp: ,  Rfl:    Turmeric 500 MG CAPS, Take 1 capsule by mouth daily., Disp: , Rfl:    vitamin B-12 (CYANOCOBALAMIN) 1000 MCG tablet, Take 1,000 mcg by mouth daily. , Disp: , Rfl:   Physical exam:  Vitals:   03/31/21 1321  BP: 126/79  Pulse: (!) 59  Resp: 17  Temp: 98.1 F (36.7 C)  TempSrc: Oral  SpO2: 100%  Weight: 157 lb 11.2 oz (71.5 kg)   Physical Exam Constitutional:      General: She is not in acute distress. Cardiovascular:     Rate and Rhythm: Normal rate and regular rhythm.     Heart sounds: Normal heart sounds.  Pulmonary:     Effort: Pulmonary effort is normal.     Breath sounds: Normal breath sounds.  Abdominal:     General: Bowel sounds are normal.     Palpations: Abdomen is soft.  Skin:    General: Skin is warm and dry.  Neurological:     Mental Status: She is alert and oriented to person, place, and time.   Breast exam was performed in seated and lying down position. Patient is status post left lumpectomy with a well-healed surgical scar. No evidence of any palpable masses. No evidence of axillary adenopathy. No evidence of any palpable masses or lumps in the right breast. No evidence of right axillary adenopathy   CMP Latest Ref Rng & Units 03/10/2018  Glucose 70 - 99 mg/dL 116(H)  BUN  8 - 23 mg/dL 30(H)  Creatinine 0.44 - 1.00 mg/dL 1.30(H)  Sodium 135 - 145 mmol/L 138  Potassium 3.5 - 5.1 mmol/L 4.2  Chloride 98 - 111 mmol/L 104  CO2 22 - 32 mmol/L 27  Calcium 8.9 - 10.3 mg/dL 9.2  Total Protein 6.5 - 8.1 g/dL 7.3  Total Bilirubin 0.3 - 1.2 mg/dL 0.6  Alkaline Phos 38 - 126 U/L 67  AST 15 - 41 U/L 26  ALT 0 - 44 U/L 24   CBC Latest Ref Rng & Units 01/06/2018  WBC 4.0 - 10.5 K/uL 3.4(L)  Hemoglobin 12.0 - 15.0 g/dL 10.3(L)  Hematocrit 36.0 - 46.0 % 32.9(L)  Platelets 150 - 400 K/uL 211     Assessment and plan- Patient is a 71 y.o. female  with pathologic prognostic stage Ia invasive mammary carcinoma of the left breast grade [id number] ER PR positive HER-2/neu negative status post lumpectomy and adjuvant radiation. She is here for routine follow up  Clinically patient is doing well without any concerning signs and symptoms of recurrence based on today's exam.Order and schedule.  Patient is not taking any endocrine therapy due to worsening osteopenia.  She would be due for another bone density until February 2024.  I will see her back in 6 months no labs she would be due for a mammogram in October 2022 which we will   Visit Diagnosis 1. Encounter for follow-up surveillance of breast cancer      Dr. Randa Evens, MD, MPH [facility] at [facility] [phone] 04/02/2021 6:31 PM

## 2021-04-05 ENCOUNTER — Encounter: Payer: Medicare PPO | Admitting: Dermatology

## 2021-05-29 ENCOUNTER — Ambulatory Visit: Payer: Medicare PPO | Admitting: Dermatology

## 2021-06-08 ENCOUNTER — Ambulatory Visit: Payer: Medicare PPO | Admitting: Dermatology

## 2021-06-08 DIAGNOSIS — L820 Inflamed seborrheic keratosis: Secondary | ICD-10-CM | POA: Diagnosis not present

## 2021-06-08 DIAGNOSIS — L570 Actinic keratosis: Secondary | ICD-10-CM | POA: Diagnosis not present

## 2021-06-08 DIAGNOSIS — L814 Other melanin hyperpigmentation: Secondary | ICD-10-CM

## 2021-06-08 DIAGNOSIS — L821 Other seborrheic keratosis: Secondary | ICD-10-CM

## 2021-06-08 NOTE — Patient Instructions (Addendum)

## 2021-06-08 NOTE — Progress Notes (Signed)
? ?  Follow-Up Visit ?  ?Subjective  ?Megan Briggs is a 71 y.o. female who presents for the following: Skin Problem (Patient here today a spot at mid upper chest. Present for a few months, stays crusty. ). ? ?Patient also with a spot at left side of nose and one at right abdomen and one at back that itches.  ? ?The following portions of the chart were reviewed this encounter and updated as appropriate:  ?  ?  ? ?Review of Systems:  No other skin or systemic complaints except as noted in HPI or Assessment and Plan. ? ?Objective  ?Well appearing patient in no apparent distress; mood and affect are within normal limits. ? ?A focused examination was performed including chest, abdomen, back. Relevant physical exam findings are noted in the Assessment and Plan. ? ?upper sternum ?Erythematous thin papules/macules with gritty scale.  ? ?right umbilical abdomen x 1, left lower back x 1, spinal lower back x 1 ?Erythematous stuck-on, waxy papule or plaque ? ? ? ?Assessment & Plan  ?AK (actinic keratosis) ?upper sternum ? ?Destruction of lesion - upper sternum ? ?Destruction method: cryotherapy   ?Informed consent: discussed and consent obtained   ?Lesion destroyed using liquid nitrogen: Yes   ?Region frozen until ice ball extended beyond lesion: Yes   ?Outcome: patient tolerated procedure well with no complications   ?Post-procedure details: wound care instructions given   ?Additional details:  Prior to procedure, discussed risks of blister formation, small wound, skin dyspigmentation, or rare scar following cryotherapy. Recommend Vaseline ointment to treated areas while healing.  ? ?Inflamed seborrheic keratosis ?right umbilical abdomen x 1, left lower back x 1, spinal lower back x 1 ? ?Destruction of lesion - right umbilical abdomen x 1, left lower back x 1, spinal lower back x 1 ? ?Destruction method: cryotherapy   ?Informed consent: discussed and consent obtained   ?Lesion destroyed using liquid nitrogen: Yes   ?Region  frozen until ice ball extended beyond lesion: Yes   ?Outcome: patient tolerated procedure well with no complications   ?Post-procedure details: wound care instructions given   ?Additional details:  Prior to procedure, discussed risks of blister formation, small wound, skin dyspigmentation, or rare scar following cryotherapy. Recommend Vaseline ointment to treated areas while healing.  ? ?Seborrheic Keratoses ?- Stuck-on, waxy, tan-brown papules and/or plaques  ?- Benign-appearing ?- Discussed benign etiology and prognosis. ?- Observe ?- Call for any changes ? ?Lentigines ?- Scattered tan macules (Lentigo vs SK) at left paranasal ?- Due to sun exposure ?- Benign-appering, observe ?- Recommend daily broad spectrum sunscreen SPF 30+ to sun-exposed areas, reapply every 2 hours as needed. ?- Call for any changes ? ?Return for AK follow up, ISK follow up, as scheduled. ? ?Graciella Belton, RMA, am acting as scribe for Brendolyn Patty, MD . ? ?Documentation: I have reviewed the above documentation for accuracy and completeness, and I agree with the above. ? ?Brendolyn Patty MD  ? ?

## 2021-09-13 ENCOUNTER — Ambulatory Visit: Admit: 2021-09-13 | Payer: Medicare PPO | Admitting: Ophthalmology

## 2021-09-13 SURGERY — PHACOEMULSIFICATION, CATARACT, WITH IOL INSERTION
Anesthesia: Topical | Laterality: Right

## 2021-09-27 ENCOUNTER — Ambulatory Visit: Admit: 2021-09-27 | Payer: Medicare PPO | Admitting: Ophthalmology

## 2021-09-27 SURGERY — PHACOEMULSIFICATION, CATARACT, WITH IOL INSERTION
Anesthesia: Topical | Laterality: Left

## 2021-09-28 ENCOUNTER — Ambulatory Visit: Payer: Medicare PPO | Admitting: Dermatology

## 2021-10-10 ENCOUNTER — Inpatient Hospital Stay: Payer: Medicare PPO | Attending: Oncology | Admitting: Oncology

## 2021-10-10 ENCOUNTER — Encounter: Payer: Self-pay | Admitting: Oncology

## 2021-10-10 VITALS — BP 128/66 | HR 70 | Temp 97.5°F | Resp 16 | Ht 65.0 in | Wt 156.2 lb

## 2021-10-10 DIAGNOSIS — Z08 Encounter for follow-up examination after completed treatment for malignant neoplasm: Secondary | ICD-10-CM

## 2021-10-10 DIAGNOSIS — M8580 Other specified disorders of bone density and structure, unspecified site: Secondary | ICD-10-CM | POA: Insufficient documentation

## 2021-10-10 DIAGNOSIS — Z87891 Personal history of nicotine dependence: Secondary | ICD-10-CM | POA: Insufficient documentation

## 2021-10-10 DIAGNOSIS — C50412 Malignant neoplasm of upper-outer quadrant of left female breast: Secondary | ICD-10-CM | POA: Insufficient documentation

## 2021-10-10 DIAGNOSIS — Z853 Personal history of malignant neoplasm of breast: Secondary | ICD-10-CM

## 2021-10-10 DIAGNOSIS — M85852 Other specified disorders of bone density and structure, left thigh: Secondary | ICD-10-CM | POA: Diagnosis not present

## 2021-10-10 DIAGNOSIS — Z170 Estrogen receptor positive status [ER+]: Secondary | ICD-10-CM | POA: Diagnosis not present

## 2021-10-10 NOTE — Progress Notes (Signed)
Hematology/Oncology Consult note [facility]  Telephone:([phone] Fax:([phone]) [phone]  Patient Care Team: Rusty Aus, MD as PCP - General (Internal Medicine)   Name of the patient: Megan Briggs  [id number]  02-17-1950   Date of visit: 10/10/21  Diagnosis-  stage I left breast cancer ER positive  Chief complaint/ Reason for visit-routine follow-up of breast cancer  Heme/Onc history: Patient is a 71 year old seen in the past for normocytic anemia.   Patient had a screening mammogram on 10/16/2017 which showed suspicious mass in the upper outer quadrant of the left breast 6 x 5 x 4 mm in size which was also confirmed on ultrasound.  No evidence of left axillary adenopathy.    Patient underwent lumpectomy and sentinel lymph node biopsy on 11/14/2017.  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  One sentinel lymph node was negative for malignancy.  pT1b pN0.  Given that her tumor was less than 1 cm and grade 1 she did not require any Oncotype testing and adjuvant chemotherapy.  Patient seen by radiation oncology and completed adjuvant radiation.   Patient was started on Arimidex in December 2019.  Baseline bone density scan showed osteopenia with a score of -1.5.  10-year probability of a major osteoporotic fracture was 15% and hip fracture was 2.5%.  Patient reported significant body aches and arthralgias with Arimidex and was switched to letrozole.  Patient was noted to have worsening osteopenia on her bone density scan in February 2022 when patient decided to stop aromatase inhibitors altogether at that time.  She did not wish to switch to tamoxifen either.  Interval history-patient reports doing well.  She denies any breast concerns.  Appetite and weight is stable.  Denies any new aches and pains anywhere  ECOG PS- 1 Pain scale- 0   Review of systems- Review of Systems  Constitutional:  Negative for  chills, fever, malaise/fatigue and weight loss.  HENT:  Negative for congestion, ear discharge and nosebleeds.   Eyes:  Negative for blurred vision.  Respiratory:  Negative for cough, hemoptysis, sputum production, shortness of breath and wheezing.   Cardiovascular:  Negative for chest pain, palpitations, orthopnea and claudication.  Gastrointestinal:  Negative for abdominal pain, blood in stool, constipation, diarrhea, heartburn, melena, nausea and vomiting.  Genitourinary:  Negative for dysuria, flank pain, frequency, hematuria and urgency.  Musculoskeletal:  Negative for back pain, joint pain and myalgias.  Skin:  Negative for rash.  Neurological:  Negative for dizziness, tingling, focal weakness, seizures, weakness and headaches.  Endo/Heme/Allergies:  Does not bruise/bleed easily.  Psychiatric/Behavioral:  Negative for depression and suicidal ideas. The patient does not have insomnia.       Allergies  Allergen Reactions   Ace Inhibitors Anaphylaxis and Swelling    Lisinopril    Tape Other (See Comments)    Band-aids caused redness and irritation Band-aids caused redness and irritation     Past Medical History:  Diagnosis Date   Anemia    Cancer ([place])    Left breast   Glaucoma    Hypertension    Neuropathy    Personal history of radiation therapy 10/2017   LEFT lumpectomy   Shoulder dislocation    septemeber 2020   Squamous cell carcinoma of skin 03/09/2014   R med clavicle    Squamous cell carcinoma of skin 02/11/2014   R chest parasternal - SCCIS    Thyroid disease  Past Surgical History:  Procedure Laterality Date   BACK SURGERY     BREAST BIOPSY Right 1995   benign   BREAST BIOPSY Left 11/01/2017   US guided biopsy of mass with distortion - ribbon clip, [facility]   BREAST LUMPECTOMY Left 11/14/2017   INVASIVE MAMMARY CARCINOMA, clear margins, negative LN   BREAST LUMPECTOMY WITH NEEDLE LOCALIZATION Left 11/14/2017   Procedure: BREAST LUMPECTOMY WITH NEEDLE  LOCALIZATION;  Surgeon: Benjamine Sprague, DO;  Location: [facility] ORS;  Service: General;  Laterality: Left;   COLONOSCOPY WITH PROPOFOL     SENTINEL NODE BIOPSY Left 11/14/2017   Procedure: SENTINEL NODE BIOPSY;  Surgeon: Benjamine Sprague, DO;  Location: [facility] ORS;  Service: General;  Laterality: Left;    Social History   Socioeconomic History   Marital status: Married    Spouse name: Not on file   Number of children: Not on file   Years of education: Not on file   Highest education level: Not on file  Occupational History   Not on file  Tobacco Use   Smoking status: Former    Packs/day: 0.25    Types: Cigarettes    Quit date: 06/08/1970    Years since quitting: 51.3   Smokeless tobacco: Never  Vaping Use   Vaping Use: Never used  Substance and Sexual Activity   Alcohol use: Yes    Alcohol/week: 3.0 standard drinks of alcohol    Types: 3 Glasses of wine per week    Comment: 1 glass of wine daily.   Drug use: No   Sexual activity: Yes  Other Topics Concern   Not on file  Social History Narrative   Not on file   Social Determinants of Health   Financial Resource Strain: Not on file  Food Insecurity: Not on file  Transportation Needs: Not on file  Physical Activity: Not on file  Stress: Not on file  Social Connections: Not on file  Intimate Partner Violence: Not on file    Family History  Problem Relation Age of Onset   Breast cancer Cousin    Breast cancer Cousin    Breast cancer Paternal Aunt      Current Outpatient Medications:    bimatoprost (LUMIGAN) 0.01 % SOLN, Place 1 drop into both eyes at bedtime., Disp: , Rfl:    Cholecalciferol (VITAMIN D) 2000 units tablet, Take 2,000 Units by mouth daily., Disp: , Rfl:    gabapentin (NEURONTIN) 300 MG capsule, Take 600 mg by mouth at bedtime. , Disp: , Rfl:    ibuprofen (ADVIL,MOTRIN) 800 MG tablet, Take 1 tablet (800 mg total) by mouth every 8 (eight) hours as needed for mild pain or moderate pain., Disp: 30 tablet, Rfl: 0    levothyroxine (SYNTHROID) 75 MCG tablet, Take 75 mcg by mouth daily before breakfast., Disp: , Rfl:    loratadine (CLARITIN) 10 MG tablet, Take 10 mg by mouth daily., Disp: , Rfl:    meloxicam (MOBIC) 7.5 MG tablet, Take 7.5 mg by mouth daily. , Disp: , Rfl:    olmesartan-hydrochlorothiazide (BENICAR HCT) 20-12.5 MG tablet, Take 0.5 tablets by mouth daily., Disp: , Rfl:    pantoprazole (PROTONIX) 40 MG tablet, Take 40 mg by mouth daily. , Disp: , Rfl:    sennosides-docusate sodium (SENOKOT-S) 8.6-50 MG tablet, Take 2 tablets by mouth at bedtime., Disp: , Rfl:    timolol (BETIMOL) 0.5 % ophthalmic solution, Place 1 drop into both eyes 2 (two) times daily., Disp: , Rfl:    Turmeric  500 MG CAPS, Take 1 capsule by mouth daily., Disp: , Rfl:    vitamin B-12 (CYANOCOBALAMIN) 1000 MCG tablet, Take 1,000 mcg by mouth daily. , Disp: , Rfl:   Physical exam:  Vitals:   10/10/21 1140  BP: 128/66  Pulse: 70  Resp: 16  Temp: (!) 97.5 F (36.4 C)  SpO2: 98%  Weight: 156 lb 3.2 oz (70.9 kg)  Height: _0  (1.651 m)   Physical Exam Constitutional:      General: She is not in acute distress. Cardiovascular:     Rate and Rhythm: Normal rate and regular rhythm.     Heart sounds: Normal heart sounds.  Pulmonary:     Effort: Pulmonary effort is normal.     Breath sounds: Normal breath sounds.  Abdominal:     General: Bowel sounds are normal.     Palpations: Abdomen is soft.  Skin:    General: Skin is warm and dry.  Neurological:     Mental Status: She is alert and oriented to person, place, and time.    Breast exam was performed in seated and lying down position. Patient is status post left lumpectomy with a well-healed surgical scar. No evidence of any palpable masses. No evidence of axillary adenopathy. No evidence of any palpable masses or lumps in the right breast. No evidence of right axillary adenopathy      Latest Ref Rng & Units 03/10/2018   11:00 AM  CMP  Glucose 70 - 99 mg/dL 116    BUN 8 - 23 mg/dL 30   Creatinine 0.44 - 1.00 mg/dL 1.30   Sodium 135 - 145 mmol/L 138   Potassium 3.5 - 5.1 mmol/L 4.2   Chloride 98 - 111 mmol/L 104   CO2 22 - 32 mmol/L 27   Calcium 8.9 - 10.3 mg/dL 9.2   Total Protein 6.5 - 8.1 g/dL 7.3   Total Bilirubin 0.3 - 1.2 mg/dL 0.6   Alkaline Phos 38 - 126 U/L 67   AST 15 - 41 U/L 26   ALT 0 - 44 U/L 24       Latest Ref Rng & Units 01/06/2018    1:30 PM  CBC  WBC 4.0 - 10.5 K/uL 3.4   Hemoglobin 12.0 - 15.0 g/dL 10.3   Hematocrit 36.0 - 46.0 % 32.9   Platelets 150 - 400 K/uL 211      Assessment and plan- Patient is a 72 y.o. female with pathologic prognostic stage Ia invasive mammary carcinoma of the left breast grade [id number] ER PR positive HER-2/neu negative status post lumpectomy and adjuvant radiation.  He is here for routine follow-up  Clinically patient is doing well with no concerning signs and symptoms of recurrence based on today's exam.  She is due for her next mammogram which has been scheduled for October 2023.  I will see her back in 6 months no labs.  She remains off endocrine therapy due to her concerns for worsening bone health  I will be scheduling a repeat bone density scan in February 2024.  Even back in 2022 her 10-year probability of a major hip fracture was more than 3% and she would technically qualify for bisphosphonates in addition to calcium and vitamin D.  I did briefly discuss oral Fosamax once a week versus Reclast once a year.  Discussed risks and benefits of Fosamax including all but not limited to nausea, risk of reflux disease.  Risks of Reclast including all but not limited to hypocalcemia  and osteonecrosis of the jaw.  Patient would think about her options and decide based on her next bone density scan   Visit Diagnosis 1. Encounter for follow-up surveillance of breast cancer   2. Osteopenia of left hip      Dr. Randa Evens, MD, MPH [facility] at [facility] [phone] 10/10/2021 1:49 PM

## 2021-11-17 ENCOUNTER — Ambulatory Visit
Admission: RE | Admit: 2021-11-17 | Discharge: 2021-11-17 | Disposition: A | Discharge status: Home / Self Care | Payer: Medicare PPO | Source: Ambulatory Visit | Attending: Oncology | Admitting: Oncology

## 2021-11-17 DIAGNOSIS — Z853 Personal history of malignant neoplasm of breast: Secondary | ICD-10-CM | POA: Insufficient documentation

## 2021-11-17 DIAGNOSIS — Z08 Encounter for follow-up examination after completed treatment for malignant neoplasm: Secondary | ICD-10-CM

## 2021-11-17 DIAGNOSIS — Z1231 Encounter for screening mammogram for malignant neoplasm of breast: Secondary | ICD-10-CM | POA: Insufficient documentation

## 2022-02-05 ENCOUNTER — Encounter: Payer: Self-pay | Admitting: Ophthalmology

## 2022-02-12 NOTE — Discharge Instructions (Signed)
   Cataract Surgery, Care After ? ?This sheet gives you information about how to care for yourself after your surgery.  Your ophthalmologist may also give you more specific instructions.  If you have problems or questions, contact your doctor at [facility], [phone]. ? ?What can I expect after the surgery? ?It is common to have: ?Itching ?Foreign body sensation (feels like a grain of sand in the eye) ?Watery discharge (excess tearing) ?Sensitivity to light and touch ?Bruising in or around the eye ?Mild blurred vision ? ?Follow these instructions at home: ?Do not touch or rub your eyes. ?You may be told to wear a protective shield or sunglasses to protect your eyes. ?Do not put a contact lens in the operative eye unless your doctor approves. ?Keep the lids and face clean and dry. ?Do not allow water to hit you directly in the face while showering. ?Keep soap and shampoo out of your eyes. ?Do not use eye makeup for 1 week. ? ?Check your eye every day for signs of infection.  Watch for: ?Redness, swelling, or pain. ?Fluid, blood or pus. ?Worsening vision. ?Worsening sensitivity to light or touch. ? ?Activity: ?During the first day, avoid bending over and reading.  You may resume reading and bending the next day. ?Do not drive or use heavy machinery for at least 24 hours. ?Avoid strenuous activities for 1 week.  Activities such as walking, treadmill, exercise bike, and climbing stairs are okay. ?Do not lift heavy (>20 pound) objects for 1 week. ?Do not do yardwork, gardening, or dirty housework (mopping, cleaning bathrooms, vacuuming, etc.) for 1 week. ?Do not swim or use a hot tub for 2 weeks. ?Ask your doctor when you can return to work. ? ?General Instructions: ?Take or apply prescription and over-the-counter medicines as directed by your doctor, including eyedrops and ointments. ?Resume medications discontinued prior to surgery, unless told otherwise by your doctor. ?Keep all follow up appointments as  scheduled. ? ?Contact a health care provider if: ?You have increased bruising around your eye. ?You have pain that is not helped with medication. ?You have a fever. ?You have fluid, pus, or blood coming from your eye or incision. ?Your sensitivity to light gets worse. ?You have spots (floaters) of flashing lights in your vision. ?You have nausea or vomiting. ? ?Go to the nearest emergency room or call 911 if: ?You have sudden loss of vision. ?You have severe, worsening eye pain. ? ?

## 2022-02-14 ENCOUNTER — Ambulatory Visit: Payer: Medicare HMO | Admitting: Anesthesiology

## 2022-02-14 ENCOUNTER — Encounter
Admission: RE | Disposition: A | Discharge status: Home / Self Care | Payer: Self-pay | Source: Ambulatory Visit | Attending: Ophthalmology

## 2022-02-14 ENCOUNTER — Other Ambulatory Visit: Payer: Self-pay

## 2022-02-14 ENCOUNTER — Encounter: Payer: Self-pay | Admitting: Ophthalmology

## 2022-02-14 ENCOUNTER — Ambulatory Visit
Admission: RE | Admit: 2022-02-14 | Discharge: 2022-02-14 | Disposition: A | Discharge status: Home / Self Care | Payer: Medicare HMO | Source: Ambulatory Visit | Attending: Ophthalmology | Admitting: Ophthalmology

## 2022-02-14 DIAGNOSIS — E039 Hypothyroidism, unspecified: Secondary | ICD-10-CM | POA: Insufficient documentation

## 2022-02-14 DIAGNOSIS — Z87891 Personal history of nicotine dependence: Secondary | ICD-10-CM | POA: Diagnosis not present

## 2022-02-14 DIAGNOSIS — Z923 Personal history of irradiation: Secondary | ICD-10-CM | POA: Insufficient documentation

## 2022-02-14 DIAGNOSIS — H409 Unspecified glaucoma: Secondary | ICD-10-CM | POA: Diagnosis not present

## 2022-02-14 DIAGNOSIS — I10 Essential (primary) hypertension: Secondary | ICD-10-CM | POA: Insufficient documentation

## 2022-02-14 DIAGNOSIS — Z85828 Personal history of other malignant neoplasm of skin: Secondary | ICD-10-CM | POA: Diagnosis not present

## 2022-02-14 DIAGNOSIS — Z853 Personal history of malignant neoplasm of breast: Secondary | ICD-10-CM | POA: Insufficient documentation

## 2022-02-14 DIAGNOSIS — E079 Disorder of thyroid, unspecified: Secondary | ICD-10-CM | POA: Diagnosis not present

## 2022-02-14 DIAGNOSIS — G629 Polyneuropathy, unspecified: Secondary | ICD-10-CM | POA: Diagnosis not present

## 2022-02-14 DIAGNOSIS — H2512 Age-related nuclear cataract, left eye: Secondary | ICD-10-CM | POA: Diagnosis present

## 2022-02-14 HISTORY — PX: CATARACT EXTRACTION W/PHACO: SHX586

## 2022-02-14 SURGERY — PHACOEMULSIFICATION, CATARACT, WITH IOL INSERTION
Anesthesia: Monitor Anesthesia Care | Site: Eye | Laterality: Left

## 2022-02-14 MED ORDER — TIMOLOL MALEATE 0.5 % OP SOLN
OPHTHALMIC | Status: DC | PRN
  Administered 2022-02-14: 1 [drp] via OPHTHALMIC

## 2022-02-14 MED ORDER — CEFUROXIME OPHTHALMIC INJECTION 1 MG/0.1 ML
INJECTION | OPHTHALMIC | Status: DC | PRN
  Administered 2022-02-14: .1 mL via INTRACAMERAL

## 2022-02-14 MED ORDER — SIGHTPATH DOSE#1 BSS IO SOLN
INTRAOCULAR | Status: DC | PRN
  Administered 2022-02-14: 1 mL

## 2022-02-14 MED ORDER — SIGHTPATH DOSE#1 NA HYALUR & NA CHOND-NA HYALUR IO KIT
PACK | INTRAOCULAR | Status: DC | PRN
  Administered 2022-02-14: 1 via OPHTHALMIC

## 2022-02-14 MED ORDER — LACTATED RINGERS IV SOLN: INTRAVENOUS | Status: DC

## 2022-02-14 MED ORDER — ARMC OPHTHALMIC DILATING DROPS
1.0000 | OPHTHALMIC | Status: DC | PRN
  Administered 2022-02-14 (×3): 1 via OPHTHALMIC

## 2022-02-14 MED ORDER — SIGHTPATH DOSE#1 BSS IO SOLN
INTRAOCULAR | Status: DC | PRN
  Administered 2022-02-14: 15 mL

## 2022-02-14 MED ORDER — MIDAZOLAM HCL 2 MG/2ML IJ SOLN
INTRAMUSCULAR | Status: DC | PRN
  Administered 2022-02-14: 1 mg via INTRAVENOUS

## 2022-02-14 MED ORDER — SIGHTPATH DOSE#1 BSS IO SOLN
INTRAOCULAR | Status: DC | PRN
  Administered 2022-02-14: 48 mL via OPHTHALMIC

## 2022-02-14 MED ORDER — FENTANYL CITRATE (PF) 100 MCG/2ML IJ SOLN
INTRAMUSCULAR | Status: DC | PRN
  Administered 2022-02-14: 50 ug via INTRAVENOUS

## 2022-02-14 MED ORDER — TETRACAINE HCL 0.5 % OP SOLN
1.0000 [drp] | OPHTHALMIC | Status: DC | PRN
  Administered 2022-02-14 (×3): 1 [drp] via OPHTHALMIC

## 2022-02-14 SURGICAL SUPPLY — 21 items
CANNULA ANT/CHMB 27G (MISCELLANEOUS) IMPLANT
CANNULA ANT/CHMB 27GA (MISCELLANEOUS) IMPLANT
CATARACT SUITE SIGHTPATH (MISCELLANEOUS) ×1 IMPLANT
FEE CATARACT SUITE SIGHTPATH (MISCELLANEOUS) ×1 IMPLANT
GLOVE SRG 8 PF TXTR STRL LF DI (GLOVE) ×1 IMPLANT
GLOVE SURG ENC TEXT LTX SZ7.5 (GLOVE) ×1 IMPLANT
GLOVE SURG GAMMEX PI TX LF 7.5 (GLOVE) IMPLANT
GLOVE SURG UNDER POLY LF SZ8 (GLOVE) ×1
LENS IOL ACRSF IQ ULTRA 23.0 (Intraocular Lens) IMPLANT
LENS IOL ACRYSOF IQ 23.0 (Intraocular Lens) ×1 IMPLANT
NDL FILTER BLUNT 18X1 1/2 (NEEDLE) ×1 IMPLANT
NDL RETROBULBAR .5 NSTRL (NEEDLE) IMPLANT
NEEDLE FILTER BLUNT 18X1 1/2 (NEEDLE) ×1 IMPLANT
PACK VIT ANT 23G (MISCELLANEOUS) IMPLANT
RING MALYGIN 7.0 (MISCELLANEOUS) IMPLANT
SUT ETHILON 10-0 CS-B-6CS-B-6 (SUTURE)
SUT VICRYL  9 0 (SUTURE)
SUT VICRYL 9 0 (SUTURE) IMPLANT
SUTURE EHLN 10-0 CS-B-6CS-B-6 (SUTURE) IMPLANT
SYR 3ML LL SCALE MARK (SYRINGE) ×1 IMPLANT
WATER STERILE IRR 250ML POUR (IV SOLUTION) ×1 IMPLANT

## 2022-02-14 NOTE — Op Note (Signed)
OPERATIVE NOTE  Megan Briggs [id number] 02/14/2022   PREOPERATIVE DIAGNOSIS:  Nuclear sclerotic cataract left eye. H25.12   POSTOPERATIVE DIAGNOSIS:    Nuclear sclerotic cataract left eye.     PROCEDURE:  Phacoemusification with posterior chamber intraocular lens placement of the left eye  Ultrasound time: Procedure(s) with comments: CATARACT EXTRACTION PHACO AND INTRAOCULAR LENS PLACEMENT (IOC) LEFT (Left) - 3.03 0:37.7  LENS:   Implant Name Type Inv. Item Serial No. Manufacturer Lot No. LRB No. Used Action  LENS IOL ACRYSOF IQ 23.0 - [id number] Intraocular Lens LENS IOL ACRYSOF IQ 23.0 [id number] [employer]  Left 1 Implanted      SURGEON:  Wyonia Hough, MD   ANESTHESIA:  Topical with tetracaine drops and 2% Xylocaine jelly, augmented with 1% preservative-free intracameral lidocaine.    COMPLICATIONS:  None.   DESCRIPTION OF PROCEDURE:  The patient was identified in the holding room and transported to the operating room and placed in the supine position under the operating microscope.  The left eye was identified as the operative eye and it was prepped and draped in the usual sterile ophthalmic fashion.   A 1 millimeter clear-corneal paracentesis was made at the 1:30 position.  0.5 ml of preservative-free 1% lidocaine was injected into the anterior chamber.  The anterior chamber was filled with Viscoat viscoelastic.  A 2.4 millimeter keratome was used to make a near-clear corneal incision at the 10:30 position.  .  A curvilinear capsulorrhexis was made with a cystotome and capsulorrhexis forceps.  Balanced salt solution was used to hydrodissect and hydrodelineate the nucleus.   Phacoemulsification was then used in stop and chop fashion to remove the lens nucleus and epinucleus.  The remaining cortex was then removed using the irrigation and aspiration handpiece. Provisc was then placed into the capsular bag to distend it for lens placement.  A lens was then injected  into the capsular bag.  The remaining viscoelastic was aspirated.   Wounds were hydrated with balanced salt solution.  The anterior chamber was inflated to a physiologic pressure with balanced salt solution.  No wound leaks were noted. Cefuroxime 0.1 ml of a '10mg'$ /ml solution was injected into the anterior chamber for a dose of 1 mg of intracameral antibiotic at the completion of the case.   Timolol and Brimonidine drops were applied to the eye.  The patient was taken to the recovery room in stable condition without complications of anesthesia or surgery.  [name] 02/14/2022, 12:11 PM

## 2022-02-14 NOTE — H&P (Signed)
[place]   Primary Care Physician:  Rusty Aus, MD Ophthalmologist: Dr. Leandrew Koyanagi  Pre-Procedure History & Physical: HPI:  Megan Briggs is a 72 y.o. female here for ophthalmic surgery.   Past Medical History:  Diagnosis Date   Anemia    Cancer ([place])    Left breast   Glaucoma    Hypertension    Neuropathy    Personal history of radiation therapy 10/2017   LEFT lumpectomy   Shoulder dislocation    septemeber 2020   Squamous cell carcinoma of skin 03/09/2014   R med clavicle    Squamous cell carcinoma of skin 02/11/2014   R chest parasternal - SCCIS    Thyroid disease     Past Surgical History:  Procedure Laterality Date   BACK SURGERY     BREAST BIOPSY Right 1995   benign   BREAST BIOPSY Left 11/01/2017   US guided biopsy of mass with distortion - ribbon clip, [facility]   BREAST LUMPECTOMY Left 11/14/2017   INVASIVE MAMMARY CARCINOMA, clear margins, negative LN   BREAST LUMPECTOMY WITH NEEDLE LOCALIZATION Left 11/14/2017   Procedure: BREAST LUMPECTOMY WITH NEEDLE LOCALIZATION;  Surgeon: Benjamine Sprague, DO;  Location: [facility] ORS;  Service: General;  Laterality: Left;   COLONOSCOPY WITH PROPOFOL     SENTINEL NODE BIOPSY Left 11/14/2017   Procedure: SENTINEL NODE BIOPSY;  Surgeon: Benjamine Sprague, DO;  Location: [facility] ORS;  Service: General;  Laterality: Left;    Prior to Admission medications   Medication Sig Start Date End Date Taking? Authorizing Provider  bimatoprost (LUMIGAN) 0.01 % SOLN Place 1 drop into both eyes at bedtime.   Yes [provider]  Cholecalciferol (VITAMIN D) 2000 units tablet Take 2,000 Units by mouth daily.   Yes [provider]  gabapentin (NEURONTIN) 300 MG capsule Take 600 mg by mouth at bedtime.  01/31/16  Yes [provider]  levothyroxine (SYNTHROID) 75 MCG tablet Take 75 mcg by mouth daily before breakfast.   Yes [provider]  loratadine (CLARITIN) 10 MG tablet Take 10 mg by mouth daily.    Yes [provider]  meloxicam (MOBIC) 7.5 MG tablet Take 7.5 mg by mouth daily.  04/22/16  Yes [provider]  olmesartan-hydrochlorothiazide (BENICAR HCT) 20-12.5 MG tablet Take 0.5 tablets by mouth daily.   Yes [provider]  pantoprazole (PROTONIX) 40 MG tablet Take 40 mg by mouth daily.  04/22/16  Yes [provider]  sennosides-docusate sodium (SENOKOT-S) 8.6-50 MG tablet Take 2 tablets by mouth at bedtime.   Yes [provider]  timolol (BETIMOL) 0.5 % ophthalmic solution Place 1 drop into both eyes 2 (two) times daily.   Yes [provider]  Turmeric 500 MG CAPS Take 1 capsule by mouth daily.   Yes [provider]  vitamin B-12 (CYANOCOBALAMIN) 1000 MCG tablet Take 1,000 mcg by mouth daily.  03/03/16  Yes [provider]  ibuprofen (ADVIL,MOTRIN) 800 MG tablet Take 1 tablet (800 mg total) by mouth every 8 (eight) hours as needed for mild pain or moderate pain. Patient not taking: Reported on 02/05/2022 11/14/17   Benjamine Sprague, DO    Allergies as of 02/02/2022 - Review Complete 10/10/2021  Allergen Reaction Noted   Ace inhibitors Anaphylaxis and Swelling 05/24/2017   Tape Other (See Comments) 11/08/2017    Family History  Problem Relation Age of Onset   Breast cancer Cousin    Breast cancer Cousin    Breast cancer Paternal  Aunt     Social History   Socioeconomic History   Marital status: Married    Spouse name: Not on file   Number of children: Not on file   Years of education: Not on file   Highest education level: Not on file  Occupational History   Not on file  Tobacco Use   Smoking status: Former    Packs/day: 0.25    Types: Cigarettes    Quit date: 06/08/1970    Years since quitting: 51.7   Smokeless tobacco: Never  Vaping Use   Vaping Use: Never used  Substance and Sexual Activity   Alcohol use: Yes    Alcohol/week: 3.0 standard drinks of alcohol    Types: 3 Glasses of wine per week     Comment: 1 glass of wine daily.   Drug use: No   Sexual activity: Yes  Other Topics Concern   Not on file  Social History Narrative   Not on file   Social Determinants of Health   Financial Resource Strain: Not on file  Food Insecurity: Not on file  Transportation Needs: Not on file  Physical Activity: Not on file  Stress: Not on file  Social Connections: Not on file  Intimate Partner Violence: Not on file    Review of Systems: See HPI, otherwise negative ROS  Physical Exam: Ht '5\' 5"'$  (1.651 m)   Wt 69.9 kg   BMI 25.63 kg/m  General:   Alert,  pleasant and cooperative in NAD Head:  Normocephalic and atraumatic. Lungs:  Clear to auscultation.    Heart:  Regular rate and rhythm.   Impression/Plan: Megan Briggs is here for ophthalmic surgery.  Risks, benefits, limitations, and alternatives regarding ophthalmic surgery have been reviewed with the patient.  Questions have been answered.  All parties agreeable.   Leandrew Koyanagi, MD  02/14/2022, 11:04 AM

## 2022-02-14 NOTE — Anesthesia Preprocedure Evaluation (Signed)
Anesthesia Evaluation  Patient identified by MRN, date of birth, ID band Patient awake    Reviewed: Allergy & Precautions, NPO status , Patient's Chart, lab work & pertinent test results  History of Anesthesia Complications Negative for: history of anesthetic complications  Airway Mallampati: III  TM Distance: <3 FB Neck ROM: full    Dental  (+) Chipped   Pulmonary neg shortness of breath, former smoker   Pulmonary exam normal        Cardiovascular hypertension, (-) angina Normal cardiovascular exam     Neuro/Psych negative neurological ROS  negative psych ROS   GI/Hepatic negative GI ROS, Neg liver ROS,,,  Endo/Other  Hypothyroidism    Renal/GU Renal disease     Musculoskeletal   Abdominal   Peds  Hematology negative hematology ROS (+)   Anesthesia Other Findings Past Medical History: No date: Anemia No date: Cancer ([place])     Comment:  Left breast No date: Glaucoma No date: Hypertension No date: Neuropathy 10/2017: Personal history of radiation therapy     Comment:  LEFT lumpectomy No date: Shoulder dislocation     Comment:  septemeber 2020 03/09/2014: Squamous cell carcinoma of skin     Comment:  R med clavicle  02/11/2014: Squamous cell carcinoma of skin     Comment:  R chest parasternal - SCCIS  No date: Thyroid disease  Past Surgical History: No date: BACK SURGERY 1995: BREAST BIOPSY; Right     Comment:  benign 11/01/2017: BREAST BIOPSY; Left     Comment:  US guided biopsy of mass with distortion - ribbon clip,               [facility] 11/14/2017: BREAST LUMPECTOMY; Left     Comment:  INVASIVE MAMMARY CARCINOMA, clear margins, negative LN 11/14/2017: BREAST LUMPECTOMY WITH NEEDLE LOCALIZATION; Left     Comment:  Procedure: BREAST LUMPECTOMY WITH NEEDLE LOCALIZATION;                Surgeon: Benjamine Sprague, DO;  Location: [facility] ORS;  Service:              General;  Laterality: Left; No date: COLONOSCOPY  WITH PROPOFOL 11/14/2017: SENTINEL NODE BIOPSY; Left     Comment:  Procedure: SENTINEL NODE BIOPSY;  Surgeon: Benjamine Sprague,              DO;  Location: [facility] ORS;  Service: General;  Laterality:               Left;  BMI    Body Mass Index: 25.63 kg/m      Reproductive/Obstetrics negative OB ROS                             Anesthesia Physical Anesthesia Plan  ASA: 2  Anesthesia Plan: MAC   Post-op Pain Management:    Induction: Intravenous  PONV Risk Score and Plan:   Airway Management Planned: Natural Airway and Nasal Cannula  Additional Equipment:   Intra-op Plan:   Post-operative Plan:   Informed Consent: I have reviewed the patients History and Physical, chart, labs and discussed the procedure including the risks, benefits and alternatives for the proposed anesthesia with the patient or authorized representative who has indicated his/her understanding and acceptance.     Dental Advisory Given  Plan Discussed with: Anesthesiologist, CRNA and Surgeon  Anesthesia Plan Comments: (Patient consented for risks of anesthesia including but not limited to:  - adverse reactions to  medications - damage to eyes, teeth, lips or other oral mucosa - nerve damage due to positioning  - sore throat or hoarseness - Damage to heart, brain, nerves, lungs, other parts of body or loss of life  Patient voiced understanding.)       Anesthesia Quick Evaluation

## 2022-02-14 NOTE — Transfer of Care (Signed)
Immediate Anesthesia Transfer of Care Note  Patient: Megan Briggs  Procedure(s) Performed: CATARACT EXTRACTION PHACO AND INTRAOCULAR LENS PLACEMENT (IOC) LEFT (Left: Eye)  Patient Location: PACU  Anesthesia Type: MAC  Level of Consciousness: awake, alert  and patient cooperative  Airway and Oxygen Therapy: Patient Spontanous Breathing and Patient connected to supplemental oxygen  Post-op Assessment: Post-op Vital signs reviewed, Patient's Cardiovascular Status Stable, Respiratory Function Stable, Patent Airway and No signs of Nausea or vomiting  Post-op Vital Signs: Reviewed and stable  Complications: No notable events documented.

## 2022-02-14 NOTE — Anesthesia Postprocedure Evaluation (Signed)
Anesthesia Post Note  Patient: Megan Briggs  Procedure(s) Performed: CATARACT EXTRACTION PHACO AND INTRAOCULAR LENS PLACEMENT (IOC) LEFT (Left: Eye)  Patient location during evaluation: PACU Anesthesia Type: MAC Level of consciousness: awake and alert Pain management: pain level controlled Vital Signs Assessment: post-procedure vital signs reviewed and stable Respiratory status: spontaneous breathing, nonlabored ventilation and respiratory function stable Cardiovascular status: blood pressure returned to baseline and stable Postop Assessment: no apparent nausea or vomiting Anesthetic complications: no   No notable events documented.   Last Vitals:  Vitals:   02/14/22 1213 02/14/22 1219  BP: 131/79 137/70  Pulse: 66 (!) 55  Resp: 10 12  Temp: (!) 36.2 C (!) 36.2 C  SpO2: 96% 95%    Last Pain:  Vitals:   02/14/22 1219  PainSc: 0-No pain                 Precious Haws Caro Brundidge

## 2022-02-15 ENCOUNTER — Encounter: Payer: Self-pay | Admitting: Ophthalmology

## 2022-02-15 ENCOUNTER — Other Ambulatory Visit: Payer: Self-pay

## 2022-02-26 NOTE — Discharge Instructions (Signed)

## 2022-02-28 ENCOUNTER — Encounter
Admission: RE | Disposition: A | Discharge status: Home / Self Care | Payer: Self-pay | Source: Ambulatory Visit | Attending: Ophthalmology

## 2022-02-28 ENCOUNTER — Other Ambulatory Visit: Payer: Self-pay

## 2022-02-28 ENCOUNTER — Ambulatory Visit: Payer: Medicare HMO | Admitting: Anesthesiology

## 2022-02-28 ENCOUNTER — Encounter: Payer: Self-pay | Admitting: Ophthalmology

## 2022-02-28 ENCOUNTER — Ambulatory Visit
Admission: RE | Admit: 2022-02-28 | Discharge: 2022-02-28 | Disposition: A | Discharge status: Home / Self Care | Payer: Medicare HMO | Source: Ambulatory Visit | Attending: Ophthalmology | Admitting: Ophthalmology

## 2022-02-28 DIAGNOSIS — H2511 Age-related nuclear cataract, right eye: Secondary | ICD-10-CM | POA: Diagnosis not present

## 2022-02-28 DIAGNOSIS — E079 Disorder of thyroid, unspecified: Secondary | ICD-10-CM | POA: Diagnosis not present

## 2022-02-28 DIAGNOSIS — Z87891 Personal history of nicotine dependence: Secondary | ICD-10-CM | POA: Insufficient documentation

## 2022-02-28 DIAGNOSIS — H409 Unspecified glaucoma: Secondary | ICD-10-CM | POA: Diagnosis not present

## 2022-02-28 DIAGNOSIS — I10 Essential (primary) hypertension: Secondary | ICD-10-CM | POA: Insufficient documentation

## 2022-02-28 HISTORY — PX: CATARACT EXTRACTION W/PHACO: SHX586

## 2022-02-28 SURGERY — PHACOEMULSIFICATION, CATARACT, WITH IOL INSERTION
Anesthesia: Monitor Anesthesia Care | Site: Eye | Laterality: Right

## 2022-02-28 MED ORDER — SIGHTPATH DOSE#1 BSS IO SOLN
INTRAOCULAR | Status: DC | PRN
  Administered 2022-02-28: 15 mL via INTRAOCULAR

## 2022-02-28 MED ORDER — SIGHTPATH DOSE#1 NA HYALUR & NA CHOND-NA HYALUR IO KIT
PACK | INTRAOCULAR | Status: DC | PRN
  Administered 2022-02-28: 1 via OPHTHALMIC

## 2022-02-28 MED ORDER — TETRACAINE HCL 0.5 % OP SOLN
1.0000 [drp] | OPHTHALMIC | Status: DC | PRN
  Administered 2022-02-28 (×3): 1 [drp] via OPHTHALMIC

## 2022-02-28 MED ORDER — SIGHTPATH DOSE#1 BSS IO SOLN
INTRAOCULAR | Status: DC | PRN
  Administered 2022-02-28: 68 mL via OPHTHALMIC

## 2022-02-28 MED ORDER — MIDAZOLAM HCL 2 MG/2ML IJ SOLN
INTRAMUSCULAR | Status: DC | PRN
  Administered 2022-02-28: 1 mg via INTRAVENOUS

## 2022-02-28 MED ORDER — FENTANYL CITRATE (PF) 100 MCG/2ML IJ SOLN
INTRAMUSCULAR | Status: DC | PRN
  Administered 2022-02-28: 50 ug via INTRAVENOUS

## 2022-02-28 MED ORDER — SIGHTPATH DOSE#1 BSS IO SOLN
INTRAOCULAR | Status: DC | PRN
  Administered 2022-02-28: 2 mL

## 2022-02-28 MED ORDER — CEFUROXIME OPHTHALMIC INJECTION 1 MG/0.1 ML
INJECTION | OPHTHALMIC | Status: DC | PRN
  Administered 2022-02-28: 1 mg via OPHTHALMIC

## 2022-02-28 MED ORDER — BRIMONIDINE TARTRATE-TIMOLOL 0.2-0.5 % OP SOLN
OPHTHALMIC | Status: DC | PRN
  Administered 2022-02-28: 1 [drp] via OPHTHALMIC

## 2022-02-28 MED ORDER — ARMC OPHTHALMIC DILATING DROPS
1.0000 | OPHTHALMIC | Status: DC | PRN
  Administered 2022-02-28 (×3): 1 via OPHTHALMIC

## 2022-02-28 MED ORDER — LACTATED RINGERS IV SOLN: INTRAVENOUS | Status: DC

## 2022-02-28 SURGICAL SUPPLY — 21 items
CANNULA ANT/CHMB 27G (MISCELLANEOUS) IMPLANT
CANNULA ANT/CHMB 27GA (MISCELLANEOUS) IMPLANT
CATARACT SUITE SIGHTPATH (MISCELLANEOUS) ×1 IMPLANT
FEE CATARACT SUITE SIGHTPATH (MISCELLANEOUS) ×1 IMPLANT
GLOVE SRG 8 PF TXTR STRL LF DI (GLOVE) ×1 IMPLANT
GLOVE SURG ENC TEXT LTX SZ7.5 (GLOVE) ×1 IMPLANT
GLOVE SURG GAMMEX PI TX LF 7.5 (GLOVE) IMPLANT
GLOVE SURG UNDER POLY LF SZ8 (GLOVE) ×1
LENS CLAREON TORIC IOL 23.0 ×1 IMPLANT
LENS IOL CLRN TRC 3 23.0 IMPLANT
NDL FILTER BLUNT 18X1 1/2 (NEEDLE) ×1 IMPLANT
NDL RETROBULBAR .5 NSTRL (NEEDLE) IMPLANT
NEEDLE FILTER BLUNT 18X1 1/2 (NEEDLE) ×1 IMPLANT
PACK VIT ANT 23G (MISCELLANEOUS) IMPLANT
RING MALYGIN 7.0 (MISCELLANEOUS) IMPLANT
SUT ETHILON 10-0 CS-B-6CS-B-6 (SUTURE)
SUT VICRYL  9 0 (SUTURE)
SUT VICRYL 9 0 (SUTURE) IMPLANT
SUTURE EHLN 10-0 CS-B-6CS-B-6 (SUTURE) IMPLANT
SYR 3ML LL SCALE MARK (SYRINGE) ×1 IMPLANT
WATER STERILE IRR 250ML POUR (IV SOLUTION) ×1 IMPLANT

## 2022-02-28 NOTE — Anesthesia Postprocedure Evaluation (Signed)
Anesthesia Post Note  Patient: Megan Briggs  Procedure(s) Performed: CATARACT EXTRACTION PHACO AND INTRAOCULAR LENS PLACEMENT (IOC) RIGHT CLAREON TORIC 4.18 32.1 (Right: Eye)  Patient location during evaluation: PACU Anesthesia Type: MAC Level of consciousness: awake and alert Pain management: pain level controlled Vital Signs Assessment: post-procedure vital signs reviewed and stable Respiratory status: spontaneous breathing, nonlabored ventilation, respiratory function stable and patient connected to nasal cannula oxygen Cardiovascular status: stable and blood pressure returned to baseline Postop Assessment: no apparent nausea or vomiting Anesthetic complications: no   No notable events documented.   Last Vitals:  Vitals:   02/28/22 1150 02/28/22 1155  BP: 130/67 (!) 148/64  Pulse: 96 (!) 54  Resp: 18 14  Temp: (!) 36.4 C (!) 36.4 C  SpO2: 95% 94%    Last Pain:  Vitals:   02/28/22 1155  TempSrc:   PainSc: 0-No pain                 Arita Miss

## 2022-02-28 NOTE — Transfer of Care (Signed)
Immediate Anesthesia Transfer of Care Note  Patient: Megan Briggs  Procedure(s) Performed: CATARACT EXTRACTION PHACO AND INTRAOCULAR LENS PLACEMENT (IOC) RIGHT CLAREON TORIC 4.18 32.1 (Right: Eye)  Patient Location: PACU  Anesthesia Type: MAC  Level of Consciousness: awake, alert  and patient cooperative  Airway and Oxygen Therapy: Patient Spontanous Breathing and Patient connected to supplemental oxygen  Post-op Assessment: Post-op Vital signs reviewed, Patient's Cardiovascular Status Stable, Respiratory Function Stable, Patent Airway and No signs of Nausea or vomiting  Post-op Vital Signs: Reviewed and stable  Complications: No notable events documented.

## 2022-02-28 NOTE — Anesthesia Preprocedure Evaluation (Signed)
Anesthesia Evaluation  Patient identified by MRN, date of birth, ID band Patient awake    Reviewed: Allergy & Precautions, NPO status , Patient's Chart, lab work & pertinent test results  History of Anesthesia Complications Negative for: history of anesthetic complications  Airway Mallampati: III  TM Distance: <3 FB Neck ROM: full    Dental  (+) Chipped   Pulmonary neg pulmonary ROS, neg shortness of breath, neg sleep apnea, neg COPD, Patient abstained from smoking.Not current smoker, former smoker   Pulmonary exam normal breath sounds clear to auscultation       Cardiovascular Exercise Tolerance: Good METShypertension, Pt. on medications (-) angina (-) CAD and (-) Past MI Normal cardiovascular exam(-) dysrhythmias  Rhythm:Regular Rate:Normal - Systolic murmurs    Neuro/Psych negative neurological ROS  negative psych ROS   GI/Hepatic negative GI ROS, Neg liver ROS,neg GERD  ,,  Endo/Other  neg diabetesHypothyroidism    Renal/GU CRFRenal disease     Musculoskeletal   Abdominal   Peds  Hematology negative hematology ROS (+)   Anesthesia Other Findings Past Medical History: No date: Anemia No date: Cancer ([place])     Comment:  Left breast No date: Glaucoma No date: Hypertension No date: Neuropathy 10/2017: Personal history of radiation therapy     Comment:  LEFT lumpectomy No date: Shoulder dislocation     Comment:  septemeber 2020 03/09/2014: Squamous cell carcinoma of skin     Comment:  R med clavicle  02/11/2014: Squamous cell carcinoma of skin     Comment:  R chest parasternal - SCCIS  No date: Thyroid disease  Past Surgical History: No date: BACK SURGERY 1995: BREAST BIOPSY; Right     Comment:  benign 11/01/2017: BREAST BIOPSY; Left     Comment:  US guided biopsy of mass with distortion - ribbon clip,               [facility] 11/14/2017: BREAST LUMPECTOMY; Left     Comment:  INVASIVE MAMMARY CARCINOMA,  clear margins, negative LN 11/14/2017: BREAST LUMPECTOMY WITH NEEDLE LOCALIZATION; Left     Comment:  Procedure: BREAST LUMPECTOMY WITH NEEDLE LOCALIZATION;                Surgeon: Benjamine Sprague, DO;  Location: [facility] ORS;  Service:              General;  Laterality: Left; No date: COLONOSCOPY WITH PROPOFOL 11/14/2017: SENTINEL NODE BIOPSY; Left     Comment:  Procedure: SENTINEL NODE BIOPSY;  Surgeon: Benjamine Sprague,              DO;  Location: [facility] OR[facility];  Service: General;  Laterality:               Left;  BMI    Body Mass Index: 25.63 kg/m      Reproductive/Obstetrics negative OB ROS                              Anesthesia Physical Anesthesia Plan  ASA: 2  Anesthesia Plan: MAC   Post-op Pain Management:    Induction: Intravenous  PONV Risk Score and Plan:   Airway Management Planned: Natural Airway and Nasal Cannula  Additional Equipment:   Intra-op Plan:   Post-operative Plan:   Informed Consent: I have reviewed the patients History and Physical, chart, labs and discussed the procedure including the risks, benefits and alternatives for the proposed anesthesia with the patient or authorized representative who  has indicated his/her understanding and acceptance.     Dental Advisory Given  Plan Discussed with: Anesthesiologist, CRNA and Surgeon  Anesthesia Plan Comments: (Patient consented for risks of anesthesia including but not limited to:  - adverse reactions to medications - damage to eyes, teeth, lips or other oral mucosa - nerve damage due to positioning  - sore throat or hoarseness - Damage to heart, brain, nerves, lungs, other parts of body or loss of life  Patient voiced understanding.)        Anesthesia Quick Evaluation

## 2022-02-28 NOTE — H&P (Signed)
[place]   Primary Care Physician:  Rusty Aus, MD Ophthalmologist: Dr. Leandrew Koyanagi  Pre-Procedure History & Physical: HPI:  Megan Briggs is a 72 y.o. female here for ophthalmic surgery.   Past Medical History:  Diagnosis Date   Anemia    Cancer ([place])    Left breast   Glaucoma    Hypertension    Neuropathy    Personal history of radiation therapy 10/2017   LEFT lumpectomy   Shoulder dislocation    septemeber 2020   Squamous cell carcinoma of skin 03/09/2014   R med clavicle    Squamous cell carcinoma of skin 02/11/2014   R chest parasternal - SCCIS    Thyroid disease     Past Surgical History:  Procedure Laterality Date   BACK SURGERY     BREAST BIOPSY Right 1995   benign   BREAST BIOPSY Left 11/01/2017   US guided biopsy of mass with distortion - ribbon clip, [facility]   BREAST LUMPECTOMY Left 11/14/2017   INVASIVE MAMMARY CARCINOMA, clear margins, negative LN   BREAST LUMPECTOMY WITH NEEDLE LOCALIZATION Left 11/14/2017   Procedure: BREAST LUMPECTOMY WITH NEEDLE LOCALIZATION;  Surgeon: Benjamine Sprague, DO;  Location: [facility] OR[facility];  Service: General;  Laterality: Left;   CATARACT EXTRACTION W/PHACO Left 02/14/2022   Procedure: CATARACT EXTRACTION PHACO AND INTRAOCULAR LENS PLACEMENT ([place]) LEFT;  Surgeon: Leandrew Koyanagi, MD;  Location: [facility];  Service: Ophthalmology;  Laterality: Left;  3.03 0:37.7   COLONOSCOPY WITH PROPOFOL     SENTINEL NODE BIOPSY Left 11/14/2017   Procedure: SENTINEL NODE BIOPSY;  Surgeon: Benjamine Sprague, DO;  Location: [facility];  Service: General;  Laterality: Left;    Prior to Admission medications   Medication Sig Start Date End Date Taking? Authorizing Provider  bimatoprost (LUMIGAN) 0.01 % SOLN Place 1 drop into both eyes at bedtime.   Yes [provider]  Cholecalciferol (VITAMIN D) 2000 units tablet Take 2,000 Units by mouth daily.   Yes [provider]  gabapentin (NEURONTIN) 300 MG capsule  Take 600 mg by mouth at bedtime.  01/31/16  Yes [provider]  levothyroxine (SYNTHROID) 75 MCG tablet Take 75 mcg by mouth daily before breakfast.   Yes [provider]  loratadine (CLARITIN) 10 MG tablet Take 10 mg by mouth daily.   Yes [provider]  meloxicam (MOBIC) 7.5 MG tablet Take 7.5 mg by mouth daily.  04/22/16  Yes [provider]  olmesartan-hydrochlorothiazide (BENICAR HCT) 20-12.5 MG tablet Take 0.5 tablets by mouth daily.   Yes [provider]  pantoprazole (PROTONIX) 40 MG tablet Take 40 mg by mouth daily.  04/22/16  Yes [provider]  sennosides-docusate sodium (SENOKOT-S) 8.6-50 MG tablet Take 2 tablets by mouth at bedtime.   Yes [provider]  timolol (BETIMOL) 0.5 % ophthalmic solution Place 1 drop into both eyes 2 (two) times daily.   Yes [provider]  Turmeric 500 MG CAPS Take 1 capsule by mouth daily.   Yes [provider]  vitamin B-12 (CYANOCOBALAMIN) 1000 MCG tablet Take 1,000 mcg by mouth daily.  03/03/16  Yes [provider]  ibuprofen (ADVIL,MOTRIN) 800 MG tablet Take 1 tablet (800 mg total) by mouth every 8 (eight) hours as needed for mild pain or moderate pain. Patient not taking: Reported on 02/05/2022 11/14/17   Benjamine Sprague, DO    Allergies as of 02/02/2022 - Review Complete 10/10/2021  Allergen Reaction Noted   Ace inhibitors Anaphylaxis  and Swelling 05/24/2017   Tape Other (See Comments) 11/08/2017    Family History  Problem Relation Age of Onset   Breast cancer Cousin    Breast cancer Cousin    Breast cancer Paternal Aunt     Social History   Socioeconomic History   Marital status: Married    Spouse name: Not on file   Number of children: Not on file   Years of education: Not on file   Highest education level: Not on file  Occupational History   Not on file  Tobacco Use   Smoking status: Former    Packs/day: 0.25    Types: Cigarettes    Quit date:  06/08/1970    Years since quitting: 51.7   Smokeless tobacco: Never  Vaping Use   Vaping Use: Never used  Substance and Sexual Activity   Alcohol use: Yes    Alcohol/week: 3.0 standard drinks of alcohol    Types: 3 Glasses of wine per week    Comment: 1 glass of wine daily.   Drug use: No   Sexual activity: Yes  Other Topics Concern   Not on file  Social History Narrative   Not on file   Social Determinants of Health   Financial Resource Strain: Not on file  Food Insecurity: Not on file  Transportation Needs: Not on file  Physical Activity: Not on file  Stress: Not on file  Social Connections: Not on file  Intimate Partner Violence: Not on file    Review of Systems: See HPI, otherwise negative ROS  Physical Exam: BP (!) 141/89   Temp (!) 97.3 F (36.3 C) (Tympanic)   Resp 12   Ht '5\' 5"'$  (1.651 m)   Wt 71.8 kg   SpO2 98%   BMI 26.34 kg/m  General:   Alert,  pleasant and cooperative in NAD Head:  Normocephalic and atraumatic. Lungs:  Clear to auscultation.    Heart:  Regular rate and rhythm.   Impression/Plan: Megan Briggs is here for ophthalmic surgery.  Risks, benefits, limitations, and alternatives regarding ophthalmic surgery have been reviewed with the patient.  Questions have been answered.  All parties agreeable.   Leandrew Koyanagi, MD  02/28/2022, 11:10 AM

## 2022-02-28 NOTE — Op Note (Signed)
  LOCATION:  [place]   PREOPERATIVE DIAGNOSIS:  Nuclear sclerotic cataract of the right eye.  H25.11   POSTOPERATIVE DIAGNOSIS:  Nuclear sclerotic cataract of the right eye.   PROCEDURE:  Phacoemulsification with Toric posterior chamber intraocular lens placement of the right eye.  Ultrasound time: Procedure(s): CATARACT EXTRACTION PHACO AND INTRAOCULAR LENS PLACEMENT (IOC) RIGHT CLAREON TORIC 4.18 32.1 (Right)  LENS:   Implant Name Type Inv. Item Serial No. Manufacturer Lot No. LRB No. Used Action  LENS CLAREON TORIC IOL 23.0 - [id number]  LENS CLAREON TORIC IOL 23.0 [id number] [employer]  Right 1 Implanted     [id number]# 23.0D Toric intraocular lens with 1.5 diopters of cylindrical power with axis orientation at 6 degrees.   SURGEON:  Wyonia Hough, MD   ANESTHESIA: Topical with tetracaine drops and 2% Xylocaine jelly, augmented with 1% preservative-free intracameral lidocaine. .   COMPLICATIONS:  None.   DESCRIPTION OF PROCEDURE:  The patient was identified in the holding room and transported to the operating suite and placed in the supine position under the operating microscope.  The right eye was identified as the operative eye, and it was prepped and draped in the usual sterile ophthalmic fashion.    A clear-corneal paracentesis incision was made at the 12:00 position.  0.5 ml of preservative-free 1% lidocaine was injected into the anterior chamber. The anterior chamber was filled with Viscoat.  A 2.4 millimeter near clear corneal incision was then made at the 9:00 position.  A cystotome and capsulorrhexis forceps were then used to make a curvilinear capsulorrhexis.  Hydrodissection and hydrodelineation were then performed using balanced salt solution.   Phacoemulsification was then used in stop and chop fashion to remove the lens, nucleus and epinucleus.  The remaining cortex was aspirated using the irrigation and aspiration handpiece.  Provisc viscoelastic was  then placed into the capsular bag to distend it for lens placement.  The Verion digital marker was used to align the implant at the intended axis.   A Toric lens was then injected into the capsular bag.  It was rotated clockwise until the axis marks on the lens were approximately 15 degrees in the counterclockwise direction to the intended alignment.  The viscoelastic was aspirated from the eye using the irrigation aspiration handpiece.  Then, a Koch spatula through the sideport incision was used to rotate the lens in a clockwise direction until the axis markings of the intraocular lens were lined up with the Verion alignment.  Balanced salt solution was then used to hydrate the wounds. Cefuroxime 0.1 ml of a '10mg'$ /ml solution was injected into the anterior chamber for a dose of 1 mg of intracameral antibiotic at the completion of the case.    The eye was noted to have a physiologic pressure and there was no wound leak noted.   Timolol and Brimonidine drops were applied to the eye.  The patient was taken to the recovery room in stable condition having had no complications of anesthesia or surgery.  Valorie Mcgrory 02/28/2022, 11:49 AM

## 2022-03-08 ENCOUNTER — Ambulatory Visit
Admission: RE | Admit: 2022-03-08 | Discharge: 2022-03-08 | Disposition: A | Discharge status: Home / Self Care | Payer: Medicare HMO | Source: Ambulatory Visit | Attending: Oncology | Admitting: Oncology

## 2022-03-08 DIAGNOSIS — Z923 Personal history of irradiation: Secondary | ICD-10-CM | POA: Insufficient documentation

## 2022-03-08 DIAGNOSIS — Z13820 Encounter for screening for osteoporosis: Secondary | ICD-10-CM | POA: Insufficient documentation

## 2022-03-08 DIAGNOSIS — Z08 Encounter for follow-up examination after completed treatment for malignant neoplasm: Secondary | ICD-10-CM | POA: Insufficient documentation

## 2022-03-08 DIAGNOSIS — M8589 Other specified disorders of bone density and structure, multiple sites: Secondary | ICD-10-CM | POA: Insufficient documentation

## 2022-03-08 DIAGNOSIS — Z853 Personal history of malignant neoplasm of breast: Secondary | ICD-10-CM | POA: Diagnosis present

## 2022-03-19 ENCOUNTER — Encounter: Payer: Self-pay | Admitting: Oncology

## 2022-03-19 ENCOUNTER — Other Ambulatory Visit: Payer: Self-pay | Admitting: *Deleted

## 2022-03-19 ENCOUNTER — Inpatient Hospital Stay: Payer: Medicare HMO | Attending: Oncology | Admitting: Oncology

## 2022-03-19 DIAGNOSIS — Z853 Personal history of malignant neoplasm of breast: Secondary | ICD-10-CM | POA: Diagnosis not present

## 2022-03-19 DIAGNOSIS — M8580 Other specified disorders of bone density and structure, unspecified site: Secondary | ICD-10-CM

## 2022-03-19 DIAGNOSIS — Z170 Estrogen receptor positive status [ER+]: Secondary | ICD-10-CM

## 2022-03-19 DIAGNOSIS — M85852 Other specified disorders of bone density and structure, left thigh: Secondary | ICD-10-CM | POA: Diagnosis not present

## 2022-03-19 DIAGNOSIS — Z08 Encounter for follow-up examination after completed treatment for malignant neoplasm: Secondary | ICD-10-CM

## 2022-03-19 NOTE — Progress Notes (Signed)
No concerns for the provider.

## 2022-03-19 NOTE — Progress Notes (Signed)
I connected with Megan Briggs on 03/19/22 at  3:15 PM EST by video enabled telemedicine visit and verified that I am speaking with the correct person using two identifiers.   I discussed the limitations, risks, security and privacy concerns of performing an evaluation and management service by telemedicine and the availability of in-person appointments. I also discussed with the patient that there may be a patient responsible charge related to this service. The patient expressed understanding and agreed to proceed.  Other persons participating in the visit and their role in the encounter:  none  Patient's location:  home Provider's location:  work  Risk analyst Complaint: Discuss bone density scan results and further management  History of present illness: Patient is a 72 year old seen in the past for normocytic anemia.   Patient had a screening mammogram on 10/16/2017 which showed suspicious mass in the upper outer quadrant of the left breast 6 x 5 x 4 mm in size which was also confirmed on ultrasound.  No evidence of left axillary adenopathy.    Patient underwent lumpectomy and sentinel lymph node biopsy on 11/14/2017.  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  One sentinel lymph node was negative for malignancy.  pT1b pN0.  Given that her tumor was less than 1 cm and grade 1 she did not require any Oncotype testing and adjuvant chemotherapy.  Patient seen by radiation oncology and completed adjuvant radiation.   Patient was started on Arimidex in December 2019.  Baseline bone density scan showed osteopenia with a score of -1.5.  10-year probability of a major osteoporotic fracture was 15% and hip fracture was 2.5%.  Patient reported significant body aches and arthralgias with Arimidex and was switched to letrozole.  Patient was noted to have worsening osteopenia on her bone density scan in February 2022 when patient decided to stop aromatase inhibitors  altogether at that time.  She did not wish to switch to tamoxifen either.  Interval history she is doing well presently and denies any breast concerns   Review of Systems  Constitutional:  Negative for chills, fever, malaise/fatigue and weight loss.  HENT:  Negative for congestion, ear discharge and nosebleeds.   Eyes:  Negative for blurred vision.  Respiratory:  Negative for cough, hemoptysis, sputum production, shortness of breath and wheezing.   Cardiovascular:  Negative for chest pain, palpitations, orthopnea and claudication.  Gastrointestinal:  Negative for abdominal pain, blood in stool, constipation, diarrhea, heartburn, melena, nausea and vomiting.  Genitourinary:  Negative for dysuria, flank pain, frequency, hematuria and urgency.  Musculoskeletal:  Negative for back pain, joint pain and myalgias.  Skin:  Negative for rash.  Neurological:  Negative for dizziness, tingling, focal weakness, seizures, weakness and headaches.  Endo/Heme/Allergies:  Does not bruise/bleed easily.  Psychiatric/Behavioral:  Negative for depression and suicidal ideas. The patient does not have insomnia.     Allergies  Allergen Reactions   Ace Inhibitors Anaphylaxis and Swelling    Lisinopril    Tape Other (See Comments)    Band-aids caused redness and irritation Band-aids caused redness and irritation    Past Medical History:  Diagnosis Date   Anemia    Cancer ([place])    Left breast   Glaucoma    Hypertension    Neuropathy    Personal history of radiation therapy 10/2017   LEFT lumpectomy   Shoulder dislocation    septemeber 2020   Squamous cell carcinoma of skin 03/09/2014   R med  clavicle    Squamous cell carcinoma of skin 02/11/2014   R chest parasternal - SCCIS    Thyroid disease     Past Surgical History:  Procedure Laterality Date   BACK SURGERY     BREAST BIOPSY Right 1995   benign   BREAST BIOPSY Left 11/01/2017   US guided biopsy of mass with distortion - ribbon clip, [facility]    BREAST LUMPECTOMY Left 11/14/2017   INVASIVE MAMMARY CARCINOMA, clear margins, negative LN   BREAST LUMPECTOMY WITH NEEDLE LOCALIZATION Left 11/14/2017   Procedure: BREAST LUMPECTOMY WITH NEEDLE LOCALIZATION;  Surgeon: Benjamine Sprague, DO;  Location: [facility];  Service: General;  Laterality: Left;   CATARACT EXTRACTION W/PHACO Left 02/14/2022   Procedure: CATARACT EXTRACTION PHACO AND INTRAOCULAR LENS PLACEMENT ([place]) LEFT;  Surgeon: Leandrew Koyanagi, MD;  Location: [place];  Service: Ophthalmology;  Laterality: Left;  3.03 0:37.7   CATARACT EXTRACTION W/PHACO Right 02/28/2022   Procedure: CATARACT EXTRACTION PHACO AND INTRAOCULAR LENS PLACEMENT ([place]) RIGHT CLAREON TORIC 4.18 32.1;  Surgeon: Leandrew Koyanagi, MD;  Location: [place];  Service: Ophthalmology;  Laterality: Right;   COLONOSCOPY WITH PROPOFOL     SENTINEL NODE BIOPSY Left 11/14/2017   Procedure: SENTINEL NODE BIOPSY;  Surgeon: Benjamine Sprague, DO;  Location: [facility];  Service: General;  Laterality: Left;    Social History   Socioeconomic History   Marital status: Married    Spouse name: Not on file   Number of children: Not on file   Years of education: Not on file   Highest education level: Not on file  Occupational History   Not on file  Tobacco Use   Smoking status: Former    Packs/day: 0.25    Types: Cigarettes    Quit date: 06/08/1970    Years since quitting: 51.8   Smokeless tobacco: Never  Vaping Use   Vaping Use: Never used  Substance and Sexual Activity   Alcohol use: Yes    Alcohol/week: 3.0 standard drinks of alcohol    Types: 3 Glasses of wine per week    Comment: 1 glass of wine daily.   Drug use: No   Sexual activity: Yes  Other Topics Concern   Not on file  Social History Narrative   Not on file   Social Determinants of Health   Financial Resource Strain: Not on file  Food Insecurity: Not on file  Transportation Needs: Not on file  Physical Activity: Not on file   Stress: Not on file  Social Connections: Not on file  Intimate Partner Violence: Not on file    Family History  Problem Relation Age of Onset   Breast cancer Cousin    Breast cancer Cousin    Breast cancer Paternal Aunt      Current Outpatient Medications:    bimatoprost (LUMIGAN) 0.01 % SOLN, Place 1 drop into both eyes at bedtime., Disp: , Rfl:    Cholecalciferol (VITAMIN D) 2000 units tablet, Take 2,000 Units by mouth daily., Disp: , Rfl:    gabapentin (NEURONTIN) 300 MG capsule, Take 600 mg by mouth at bedtime. , Disp: , Rfl:    levothyroxine (SYNTHROID) 75 MCG tablet, Take 75 mcg by mouth daily before breakfast., Disp: , Rfl:    loratadine (CLARITIN) 10 MG tablet, Take 10 mg by mouth daily., Disp: , Rfl:    meloxicam (MOBIC) 7.5 MG tablet, Take 7.5 mg by mouth daily. , Disp: , Rfl:    olmesartan-hydrochlorothiazide (BENICAR HCT) 20-12.5 MG tablet, Take  0.5 tablets by mouth daily., Disp: , Rfl:    pantoprazole (PROTONIX) 40 MG tablet, Take 40 mg by mouth daily. , Disp: , Rfl:    sennosides-docusate sodium (SENOKOT-S) 8.6-50 MG tablet, Take 2 tablets by mouth at bedtime., Disp: , Rfl:    timolol (BETIMOL) 0.5 % ophthalmic solution, Place 1 drop into both eyes 2 (two) times daily., Disp: , Rfl:    Turmeric 500 MG CAPS, Take 1 capsule by mouth daily., Disp: , Rfl:    vitamin B-12 (CYANOCOBALAMIN) 1000 MCG tablet, Take 1,000 mcg by mouth daily. , Disp: , Rfl:    ibuprofen (ADVIL,MOTRIN) 800 MG tablet, Take 1 tablet (800 mg total) by mouth every 8 (eight) hours as needed for mild pain or moderate pain. (Patient not taking: Reported on 02/05/2022), Disp: 30 tablet, Rfl: 0  DG Bone Density  Result Date: 03/08/2022 EXAM: DUAL X-RAY ABSORPTIOMETRY (DXA) FOR BONE MINERAL DENSITY IMPRESSION: Your patient Aziah Cepero completed a BMD test on 03/08/2022 using the [employer] (software version: 14.10) manufactured by [employer]. The following summarizes the results of our  evaluation. Technologist: SCE PATIENT BIOGRAPHICAL: Name: Sharol, Thornley Patient ID: [id number] Birth Date: 17-Jun-1950 Height: 64.5 in. Gender: Female Exam Date: 03/08/2022 Weight: 156.6 lbs. Indications: Family Hist. (Parent hip fracture), History of Breast Cancer, History of Fracture (Adult), History of Radiation, History of Spinal Surgery, Parent Hip Fracture, Postmenopausal Fractures: Humerus Left Treatments: Claritin, Gabapentin, Levothyroxine, meloxicam, Protonix, Vitamin D DENSITOMETRY RESULTS: Site          Region     Measured Date Measured Age WHO Classification Young Adult T-score BMD         %Change vs. Previous Significant Change (*) DualFemur Neck Left 03/08/2020 73.5 Osteopenia -1.7 0.800 g/cm2 -1.0% - DualFemur Neck Left 03/07/2020 71.5 Osteopenia -1.7 0.808 g/cm2 -3.2% - DualFemur Neck Left 03/05/2018 69.5 Osteopenia -1.5 0.835 g/cm2 - - DualFemur Total Mean 03/08/2022 73.5 Osteopenia -1.3 0.847 g/cm2 -1.9% - DualFemur Total Mean 03/07/2020 71.5 Osteopenia -1.1 0.863 g/cm2 -3.1% Yes DualFemur Total Mean 03/05/2018 69.5 Normal -0.9 0.891 g/cm2 - - Right Forearm Radius 33% 03/08/2022 73.5 Osteopenia -1.1 0.780 g/cm2 2.2% - Right Forearm Radius 33% 03/07/2020 71.5 Osteopenia -1.3 0.763 g/cm2 -7.3% Yes Right Forearm Radius 33% 03/05/2018 69.5 Normal -0.6 0.823 g/cm2 - - ASSESSMENT: The BMD measured at Femur Neck Left is 0.800 g/cm2 with a T-score of -1.7. This patient is considered osteopenic according to [facility]) criteria. The scan quality is good. Lumbar spine was not utilized due to advanced degenerative changes/scoliosis and previous surgery. Compared with prior study, there has been no significant change in the total hip. World Pharmacologist [facility]) criteria for post-menopausal, Caucasian Women: Normal:                   T-score at or above -1 SD Osteopenia/low bone mass: T-score between -1 and -2.5 SD Osteoporosis:             T-score at or below -2.5 SD RECOMMENDATIONS:  1. All patients should optimize calcium and vitamin D intake. 2. Consider FDA-approved medical therapies in postmenopausal women and men aged 52 years and older, based on the following: a. A hip or vertebral(clinical or morphometric) fracture b. T-score < -2.5 at the femoral neck or spine after appropriate evaluation to exclude secondary causes c. Low bone mass (T-score between -1.0 and -2.5 at the femoral neck or spine) and a 10-year probability of a hip fracture > 3%  or a 10-year probability of a major osteoporosis-related fracture > 20% based on the [place]-adapted WHO algorithm 3. Clinician judgment and/or patient preferences may indicate treatment for people with 10-year fracture probabilities above or below these levels FOLLOW-UP: People with diagnosed cases of osteoporosis or at high risk for fracture should have regular bone mineral density tests. For patients eligible for Medicare, routine testing is allowed once every 2 years. The testing frequency can be increased to one year for patients who have rapidly progressing disease, those who are receiving or discontinuing medical therapy to restore bone mass, or have additional risk factors. I have reviewed this report, and agree with the above findings. [facility] Dear Dr Janese Banks, Your patient LANYIAH CLABORN completed a FRAX assessment on 03/08/2022 using the [place] (analysis version: 14.10) manufactured by [employer]. The following summarizes the results of our evaluation. PATIENT BIOGRAPHICAL: Name: Abiola, Kuehnle Patient ID: [id number] Birth Date: Apr 21, 1950 Height:    64.5 in. Gender:     Female    [age]       Weight:    156.6 lbs. Ethnicity:  White                            Exam Date: 03/08/2022 FRAX* RESULTS:  (version: 3.5) 10-year Probability of Fracture1 Major Osteoporotic Fracture2 Hip Fracture 27.8% 11.7% Population: [place] (Caucasian) Risk Factors: Family Hist. (Parent hip fracture), History of Fracture (Adult)  Based on Femur (Left) Neck BMD 1 -The 10-year probability of fracture may be lower than reported if the patient has received treatment. 2 -Major Osteoporotic Fracture: Clinical Spine, Forearm, Hip or Shoulder *FRAX is a Materials engineer of the [employer] for Metabolic Bone Disease, a [place] (WHO) [facility]. ASSESSMENT: The probability of a major osteoporotic fracture is 27.8% within the next ten years. The probability of a hip fracture is 11.7% within the next ten years. . Electronically Signed   By: Zerita Boers M.D.   On: 03/08/2022 10:13    No images are attached to the encounter.      Latest Ref Rng & Units 03/10/2018   11:00 AM  CMP  Glucose 70 - 99 mg/dL 116   BUN 8 - 23 mg/dL 30   Creatinine 0.44 - 1.00 mg/dL 1.30   Sodium 135 - 145 mmol/L 138   Potassium 3.5 - 5.1 mmol/L 4.2   Chloride 98 - 111 mmol/L 104   CO2 22 - 32 mmol/L 27   Calcium 8.9 - 10.3 mg/dL 9.2   Total Protein 6.5 - 8.1 g/dL 7.3   Total Bilirubin 0.3 - 1.2 mg/dL 0.6   Alkaline Phos 38 - 126 U/L 67   AST 15 - 41 U/L 26   ALT 0 - 44 U/L 24       Latest Ref Rng & Units 01/06/2018    1:30 PM  CBC  WBC 4.0 - 10.5 K/uL 3.4   Hemoglobin 12.0 - 15.0 g/dL 10.3   Hematocrit 36.0 - 46.0 % 32.9   Platelets 150 - 400 K/uL 211      Observation/objective: Appears in no acute distress over video visit today.  Breathing is nonlabored  Assessment and plan: Patient is a 72 year old female with a history of stage I left breast cancer ER/PR positive HER2 negative status postlumpectomy and adjuvant radiation therapy in 2019.  This is a routine  follow-up visit.  Overall patient is doing well and reports no concerning symptoms.  She is concerned that she has lost about 3 inches in her height and I recommended we could get an x-ray to rule out any compression fractures.  Patient would like to hold off on that.  Patient is not taking any endocrine therapy presently  Her  present bone density scan which was compared to her prior scan in 2022 shows worsening osteopenia in her left femur.  Her 10-year probability of major osteoporotic fracture has gone upFrom 17 to 27% and hip fracture is gone up from 5% to 11.7%.  We discussed trying Fosamax once a week which is an oral pill to be taken in an upright position and she cannot take any other pills for the next 1 hour.  She cannot lie down after taking the pill for another hour.  Potential side effects of Fosamax include reflux symptoms.  Alternative to tablet would be a yearly Reclast infusions which would be a 30-minute infusion.  Discussed risks and benefits of Reclast including all but not limited to hypocalcemia, flulike symptoms and possible risk of osteonecrosis of the jaw.  Need to obtain dental clearance prior to giving Reclast.  Patient would like to think about her options and let me know.  Follow-up instructions: I will see her back in 6 months and check her vitamin D levels.  I will also schedule her mammogram for October 2024.  I discussed the assessment and treatment plan with the patient. The patient was provided an opportunity to ask questions and all were answered. The patient agreed with the plan and demonstrated an understanding of the instructions.   The patient was advised to call back or seek an in-person evaluation if the symptoms worsen or if the condition fails to improve as anticipated.  I provided 12 minutes of face-to-face video visit time during this encounter, and > 50% was spent counseling as documented under my assessment & plan.  Visit Diagnosis: 1. Encounter for follow-up surveillance of breast cancer   2. Osteopenia of neck of left femur     Dr. Randa Evens, MD, MPH [facility] Tel- [phone] 03/19/2022 3:44 PM

## 2022-03-30 ENCOUNTER — Telehealth: Payer: Medicare HMO | Admitting: Oncology

## 2022-04-11 ENCOUNTER — Ambulatory Visit: Payer: Medicare PPO | Admitting: Oncology

## 2022-06-21 ENCOUNTER — Ambulatory Visit: Payer: Medicare HMO | Admitting: Dermatology

## 2022-06-21 VITALS — BP 116/70 | HR 68

## 2022-06-21 DIAGNOSIS — D1801 Hemangioma of skin and subcutaneous tissue: Secondary | ICD-10-CM

## 2022-06-21 DIAGNOSIS — L820 Inflamed seborrheic keratosis: Secondary | ICD-10-CM | POA: Diagnosis not present

## 2022-06-21 DIAGNOSIS — L570 Actinic keratosis: Secondary | ICD-10-CM

## 2022-06-21 DIAGNOSIS — L578 Other skin changes due to chronic exposure to nonionizing radiation: Secondary | ICD-10-CM | POA: Diagnosis not present

## 2022-06-21 DIAGNOSIS — W908XXA Exposure to other nonionizing radiation, initial encounter: Secondary | ICD-10-CM | POA: Diagnosis not present

## 2022-06-21 DIAGNOSIS — L821 Other seborrheic keratosis: Secondary | ICD-10-CM

## 2022-06-21 DIAGNOSIS — X32XXXA Exposure to sunlight, initial encounter: Secondary | ICD-10-CM

## 2022-06-21 DIAGNOSIS — Z1283 Encounter for screening for malignant neoplasm of skin: Secondary | ICD-10-CM | POA: Diagnosis not present

## 2022-06-21 DIAGNOSIS — Z85828 Personal history of other malignant neoplasm of skin: Secondary | ICD-10-CM

## 2022-06-21 DIAGNOSIS — D229 Melanocytic nevi, unspecified: Secondary | ICD-10-CM

## 2022-06-21 DIAGNOSIS — D692 Other nonthrombocytopenic purpura: Secondary | ICD-10-CM

## 2022-06-21 DIAGNOSIS — L814 Other melanin hyperpigmentation: Secondary | ICD-10-CM

## 2022-06-21 DIAGNOSIS — Z8589 Personal history of malignant neoplasm of other organs and systems: Secondary | ICD-10-CM

## 2022-06-21 NOTE — Progress Notes (Signed)
Follow-Up Visit   Subjective  Megan Briggs is a 72 y.o. female who presents for the following: Skin Cancer Screening and Full Body Skin Exam  The patient presents for Total-Body Skin Exam (TBSE) for skin cancer screening and mole check. The patient has spots, moles and lesions to be evaluated, some may be new or changing and the patient has concerns that these could be cancer.    The following portions of the chart were reviewed this encounter and updated as appropriate: medications, allergies, medical history  Review of Systems:  No other skin or systemic complaints except as noted in HPI or Assessment and Plan.  Objective  Well appearing patient in no apparent distress; mood and affect are within normal limits.  A full examination was performed including scalp, head, eyes, ears, nose, lips, neck, chest, axillae, abdomen, back, buttocks, bilateral upper extremities, bilateral lower extremities, hands, feet, fingers, toes, fingernails, and toenails. All findings within normal limits unless otherwise noted below.   Relevant physical exam findings are noted in the Assessment and Plan.  Abdomen x 15 (15) Erythematous stuck-on, waxy papule or plaque  L nose x 1, L cheek x 1 (2) Erythematous thin papules/macules with gritty scale.     Assessment & Plan   LENTIGINES, SEBORRHEIC KERATOSES, HEMANGIOMAS - Benign normal skin lesions - Benign-appearing - Call for any changes  MELANOCYTIC NEVI - Tan-brown and/or pink-flesh-colored symmetric macules and papules - Benign appearing on exam today - Observation - Call clinic for new or changing moles - Recommend daily use of broad spectrum spf 30+ sunscreen to sun-exposed areas.   ACTINIC DAMAGE - Chronic condition, secondary to cumulative UV/sun exposure - diffuse scaly erythematous macules with underlying dyspigmentation - Recommend daily broad spectrum sunscreen SPF 30+ to sun-exposed areas, reapply every 2 hours as needed.  -  Staying in the shade or wearing long sleeves, sun glasses (UVA+UVB protection) and wide brim hats (4-inch brim around the entire circumference of the hat) are also recommended for sun protection.  - Call for new or changing lesions.  HISTORY OF SQUAMOUS CELL CARCINOMA OF THE SKIN IN SITU - No evidence of recurrence today - No lymphadenopathy - Recommend regular full body skin exams - Recommend daily broad spectrum sunscreen SPF 30+ to sun-exposed areas, reapply every 2 hours as needed.  - Call if any new or changing lesions are noted between office visits  SEBORRHEIC KERATOSIS - Stuck-on, waxy, tan-brown papules and/or plaques  - Benign-appearing - Discussed benign etiology and prognosis. - Observe - Call for any changes - Apply diclofenac (voltaren) gel twice a day to spots.   Purpura - Chronic; persistent and recurrent.  Treatable, but not curable. - Violaceous macules and patches - Benign - Related to trauma, age, sun damage and/or use of blood thinners, chronic use of topical and/or oral steroids - Observe - Can use OTC arnica containing moisturizer such as Dermend Bruise Formula if desired - Call for worsening or other concerns  Inflamed seborrheic keratosis (15) Abdomen x 15  Symptomatic, irritating, patient would like treated.   Destruction of lesion - Abdomen x 15 Complexity: simple   Destruction method: cryotherapy   Informed consent: discussed and consent obtained   Timeout:  patient name, date of birth, surgical site, and procedure verified Lesion destroyed using liquid nitrogen: Yes   Region frozen until ice ball extended beyond lesion: Yes   Outcome: patient tolerated procedure well with no complications   Post-procedure details: wound care instructions given  AK (actinic keratosis) (2) L nose x 1, L cheek x 1  Destruction of lesion - L nose x 1, L cheek x 1 Complexity: simple   Destruction method: cryotherapy   Informed consent: discussed and consent  obtained   Timeout:  patient name, date of birth, surgical site, and procedure verified Lesion destroyed using liquid nitrogen: Yes   Region frozen until ice ball extended beyond lesion: Yes   Outcome: patient tolerated procedure well with no complications   Post-procedure details: wound care instructions given    SKIN CANCER SCREENING PERFORMED TODAY.  Return in about 1 year (around 06/21/2023) for TBSE.  Maylene Roes, CMA, am acting as scribe for Armida Sans, MD .   Documentation: I have reviewed the above documentation for accuracy and completeness, and I agree with the above.  Armida Sans, MD

## 2022-06-21 NOTE — Patient Instructions (Addendum)
Apply diclofenac (voltaren) gel twice a day to brown spots.    Due to recent changes in healthcare laws, you may see results of your pathology and/or laboratory studies on MyChart before the doctors have had a chance to review them. We understand that in some cases there may be results that are confusing or concerning to you. Please understand that not all results are received at the same time and often the doctors may need to interpret multiple results in order to provide you with the best plan of care or course of treatment. Therefore, we ask that you please give [place] 2 business days to thoroughly review all your results before contacting the office for clarification. Should we see a critical lab result, you will be contacted sooner.   If You Need Anything After Your Visit  If you have any questions or concerns for your doctor, please call our main line at [phone] and press option 4 to reach your doctor's medical assistant. If no one answers, please leave a voicemail as directed and we will return your call as soon as possible. Messages left after 4 pm will be answered the following business day.   You may also send [place] a message via MyChart. We typically respond to MyChart messages within 1-2 business days.  For prescription refills, please ask your pharmacy to contact our office. Our fax number is [phone].  If you have an urgent issue when the clinic is closed that cannot wait until the next business day, you can page your doctor at the number below.    Please note that while we do our best to be available for urgent issues outside of office hours, we are not available 24/7.   If you have an urgent issue and are unable to reach [place], you may choose to seek medical care at your doctor's office, retail clinic, urgent care center, or emergency room.  If you have a medical emergency, please immediately call 911 or go to the emergency department.  Pager Numbers  - Dr. Gwen Pounds:  [phone]  - Dr. Neale Burly: [phone]  - Dr. Roseanne Reno: [phone]  In the event of inclement weather, please call our main line at [phone] for an update on the status of any delays or closures.  Dermatology Medication Tips: Please keep the boxes that topical medications come in in order to help keep track of the instructions about where and how to use these. Pharmacies typically print the medication instructions only on the boxes and not directly on the medication tubes.   If your medication is too expensive, please contact our office at [phone] option 4 or send [place] a message through MyChart.   We are unable to tell what your co-pay for medications will be in advance as this is different depending on your insurance coverage. However, we may be able to find a substitute medication at lower cost or fill out paperwork to get insurance to cover a needed medication.   If a prior authorization is required to get your medication covered by your insurance company, please allow [place] 1-2 business days to complete this process.  Drug prices often vary depending on where the prescription is filled and some pharmacies may offer cheaper prices.  The website www.goodrx.com contains coupons for medications through different pharmacies. The prices here do not account for what the cost may be with help from insurance (it may be cheaper with your insurance), but the website can give you the price if you did not use any  insurance.  - You can print the associated coupon and take it with your prescription to the pharmacy.  - You may also stop by our office during regular business hours and pick up a GoodRx coupon card.  - If you need your prescription sent electronically to a different pharmacy, notify our office through [facility] or by phone at [phone] option 4.     Si Usted Necesita Algo Despus de Su Visita  Tambin puede enviarnos un mensaje a travs de Clinical cytogeneticist. Por lo general  respondemos a los mensajes de MyChart en el transcurso de 1 a 2 das hbiles.  Para renovar recetas, por favor pida a su farmacia que se ponga en contacto con nuestra oficina. Annie Sable de fax es [place] [phone].  Si tiene un asunto urgente cuando la clnica est cerrada y que no puede esperar hasta el siguiente da hbil, puede llamar/localizar a su doctor(a) al nmero que aparece a continuacin.   Por favor, tenga en cuenta que aunque hacemos todo lo posible para estar disponibles para asuntos urgentes fuera del horario de [place], no estamos disponibles las 24 horas del da, los 7 [address]  [address] de la [place].   Si tiene un problema urgente y no puede comunicarse con nosotros, puede optar por buscar atencin mdica  en el consultorio de su doctor(a), en una clnica privada, en un centro de atencin urgente o en una sala de emergencias.  Si tiene Engineer, drilling, por favor llame inmediatamente al 911 o vaya a la sala de emergencias.  Nmeros de bper  - Dr. Gwen Pounds: [phone]  - Dra. Moye: [phone]  - Dra. Roseanne Reno: [phone]  En caso de inclemencias del [place], por favor llame a Lacy Duverney principal al [phone] para una actualizacin sobre el [place] de cualquier retraso o cierre.  Consejos para la medicacin en dermatologa: Por favor, guarde las cajas en las que vienen los medicamentos de uso tpico para ayudarle a seguir las instrucciones sobre dnde y cmo usarlos. Las farmacias generalmente imprimen las instrucciones del medicamento slo en las cajas y no directamente en los tubos del [place].   Si su medicamento es muy caro, por favor, pngase en contacto con Rolm Gala llamando al [phone] y presione la opcin 4 o envenos un mensaje a travs de Clinical cytogeneticist.   No podemos decirle cul ser su copago por los medicamentos por adelantado ya que esto es diferente dependiendo de la cobertura de su seguro. Sin embargo, es posible que podamos encontrar un  medicamento sustituto a Audiological scientist un formulario para que el seguro cubra el medicamento que se considera necesario.   Si se requiere una autorizacin previa para que su compaa de seguros [place] su medicamento, por favor permtanos de 1 a 2 das hbiles para completar [address]  Los precios de los medicamentos varan con frecuencia dependiendo del Environmental consultant de dnde se surte la receta y alguna farmacias pueden ofrecer precios ms baratos.  El sitio web www.goodrx.com tiene cupones para medicamentos de Health and safety inspector. Los precios aqu no tienen en cuenta lo que podra costar con la ayuda del seguro (puede ser ms barato con su seguro), pero el sitio web puede darle el precio si no utiliz Tourist information centre manager.  - Puede imprimir el cupn correspondiente y llevarlo con su receta a la farmacia.  - Tambin puede pasar por nuestra oficina durante el horario de atencin regular y Education officer, museum una tarjeta de cupones de GoodRx.  - Si necesita que su receta se enve electrnicamente a [place]om[place] Dear  farmacia diferente, informe a nuestra oficina a travs de MyChart de [place] o por telfono llamando al [phone] y presione la opcin 4.

## 2022-07-04 ENCOUNTER — Encounter: Payer: Self-pay | Admitting: Dermatology

## 2022-09-17 ENCOUNTER — Inpatient Hospital Stay (HOSPITAL_BASED_OUTPATIENT_CLINIC_OR_DEPARTMENT_OTHER): Payer: Medicare HMO | Admitting: Oncology

## 2022-09-17 ENCOUNTER — Inpatient Hospital Stay: Payer: Medicare HMO | Attending: Oncology

## 2022-09-17 ENCOUNTER — Encounter: Payer: Self-pay | Admitting: Oncology

## 2022-09-17 VITALS — BP 154/75 | HR 58 | Temp 97.6°F | Resp 18 | Ht 65.0 in | Wt 159.3 lb

## 2022-09-17 DIAGNOSIS — Z853 Personal history of malignant neoplasm of breast: Secondary | ICD-10-CM | POA: Diagnosis not present

## 2022-09-17 DIAGNOSIS — Z08 Encounter for follow-up examination after completed treatment for malignant neoplasm: Secondary | ICD-10-CM

## 2022-09-17 DIAGNOSIS — M85852 Other specified disorders of bone density and structure, left thigh: Secondary | ICD-10-CM | POA: Insufficient documentation

## 2022-09-17 DIAGNOSIS — Z780 Asymptomatic menopausal state: Secondary | ICD-10-CM

## 2022-09-17 DIAGNOSIS — C50412 Malignant neoplasm of upper-outer quadrant of left female breast: Secondary | ICD-10-CM

## 2022-09-17 LAB — VITAMIN D 25 HYDROXY (VIT D DEFICIENCY, FRACTURES): Vit D, 25-Hydroxy: 57.28 ng/mL (ref 30–100)

## 2022-09-18 NOTE — Progress Notes (Signed)
Hematology/Oncology Consult note [facility]  Telephone:([phone] [phone]  Patient Care Team: Danella Penton, MD as PCP - General (Internal Medicine) Creig Hines, MD as Consulting Physician (Oncology)   Name of the patient: Megan Briggs  [id number]  1950/03/21   Date of visit: 09/18/22  Diagnosis-history of stage I left breast cancer in 2019  Chief complaint/ Reason for visit-routine follow-up of breast cancer  Heme/Onc history:  Patient is a 72 year old seen in the past for normocytic anemia.   Patient had a screening mammogram on 10/16/2017 which showed suspicious mass in the upper outer quadrant of the left breast 6 x 5 x 4 mm in size which was also confirmed on ultrasound.  No evidence of left axillary adenopathy.    Patient underwent lumpectomy and sentinel lymph node biopsy on 11/14/2017.  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  One sentinel lymph node was negative for malignancy.  pT1b pN0.  Given that her tumor was less than 1 cm and grade 1 she did not require any Oncotype testing and adjuvant chemotherapy.  Patient seen by radiation oncology and completed adjuvant radiation.   Patient was started on Arimidex in December 2019.  Baseline bone density scan showed osteopenia with a score of -1.5.  10-year probability of a major osteoporotic fracture was 15% and hip fracture was 2.5%.  Patient reported significant body aches and arthralgias with Arimidex and was switched to letrozole.  Patient was noted to have worsening osteopenia on her bone density scan in February 2022 when patient decided to stop aromatase inhibitors altogether at that time.  She did not wish to switch to tamoxifen either.    Interval history-patient is doing well presently.  Appetite and weight have remained stable.  She denies any breast concerns or new aches and pains anywhere.  No recent falls.  ECOG PS- 1 Pain  scale- 0   Review of systems- Review of Systems  Constitutional:  Negative for chills, fever, malaise/fatigue and weight loss.  HENT:  Negative for congestion, ear discharge and nosebleeds.   Eyes:  Negative for blurred vision.  Respiratory:  Negative for cough, hemoptysis, sputum production, shortness of breath and wheezing.   Cardiovascular:  Negative for chest pain, palpitations, orthopnea and claudication.  Gastrointestinal:  Negative for abdominal pain, blood in stool, constipation, diarrhea, heartburn, melena, nausea and vomiting.  Genitourinary:  Negative for dysuria, flank pain, frequency, hematuria and urgency.  Musculoskeletal:  Negative for back pain, joint pain and myalgias.  Skin:  Negative for rash.  Neurological:  Negative for dizziness, tingling, focal weakness, seizures, weakness and headaches.  Endo/Heme/Allergies:  Does not bruise/bleed easily.  Psychiatric/Behavioral:  Negative for depression and suicidal ideas. The patient does not have insomnia.       Allergies  Allergen Reactions   Ace Inhibitors Anaphylaxis and Swelling    Lisinopril    Tape Other (See Comments)    Band-aids caused redness and irritation Band-aids caused redness and irritation     Past Medical History:  Diagnosis Date   Anemia    Cancer (HCC)    Left breast   Glaucoma    Hypertension    Neuropathy    Personal history of radiation therapy 10/2017   LEFT lumpectomy   Shoulder dislocation    septemeber 2020   Squamous cell carcinoma of skin 03/09/2014   R med clavicle    Squamous cell carcinoma of skin 02/11/2014  R chest parasternal - SCCIS    Thyroid disease      Past Surgical History:  Procedure Laterality Date   BACK SURGERY     BREAST BIOPSY Right 1995   benign   BREAST BIOPSY Left 11/01/2017   US guided biopsy of mass with distortion - ribbon clip, [facility]   BREAST LUMPECTOMY Left 11/14/2017   INVASIVE MAMMARY CARCINOMA, clear margins, negative LN   BREAST LUMPECTOMY  WITH NEEDLE LOCALIZATION Left 11/14/2017   Procedure: BREAST LUMPECTOMY WITH NEEDLE LOCALIZATION;  Surgeon: Sung Amabile, DO;  Location: [facility];  Service: General;  Laterality: Left;   CATARACT EXTRACTION W/PHACO Left 02/14/2022   Procedure: CATARACT EXTRACTION PHACO AND INTRAOCULAR LENS PLACEMENT (IOC) LEFT;  Surgeon: Lockie Mola, MD;  Location: [facility];  Service: Ophthalmology;  Laterality: Left;  3.03 0:37.7   CATARACT EXTRACTION W/PHACO Right 02/28/2022   Procedure: CATARACT EXTRACTION PHACO AND INTRAOCULAR LENS PLACEMENT (IOC) RIGHT CLAREON TORIC 4.18 32.1;  Surgeon: Lockie Mola, MD;  Location: [facility];  Service: Ophthalmology;  Laterality: Right;   COLONOSCOPY WITH PROPOFOL     SENTINEL NODE BIOPSY Left 11/14/2017   Procedure: SENTINEL NODE BIOPSY;  Surgeon: Sung Amabile, DO;  Location: [facility];  Service: General;  Laterality: Left;    Social History   Socioeconomic History   Marital status: Married    Spouse name: Not on file   Number of children: Not on file   Years of education: Not on file   Highest education level: Not on file  Occupational History   Not on file  Tobacco Use   Smoking status: Former    Current packs/day: 0.00    Types: Cigarettes    Quit date: 06/08/1970    Years since quitting: 52.3   Smokeless tobacco: Never  Vaping Use   Vaping status: Never Used  Substance and Sexual Activity   Alcohol use: Yes    Alcohol/week: 3.0 standard drinks of alcohol    Types: 3 Glasses of wine per week    Comment: 1 glass of wine daily.   Drug use: No   Sexual activity: Yes  Other Topics Concern   Not on file  Social History Narrative   Not on file   Social Determinants of Health   Financial Resource Strain: Not on file  Food Insecurity: Not on file  Transportation Needs: Not on file  Physical Activity: Not on file  Stress: Not on file  Social Connections: Not on file  Intimate Partner Violence: Not on file     Family History  Problem Relation Age of Onset   Breast cancer Cousin    Breast cancer Cousin    Breast cancer Paternal Aunt      Current Outpatient Medications:    bimatoprost (LUMIGAN) 0.01 % SOLN, Place 1 drop into both eyes at bedtime., Disp: , Rfl:    Cholecalciferol (VITAMIN D) 2000 units tablet, Take 2,000 Units by mouth daily., Disp: , Rfl:    gabapentin (NEURONTIN) 300 MG capsule, Take 600 mg by mouth at bedtime. , Disp: , Rfl:    levothyroxine (SYNTHROID) 75 MCG tablet, Take 75 mcg by mouth daily before breakfast., Disp: , Rfl:    loratadine (CLARITIN) 10 MG tablet, Take 10 mg by mouth daily., Disp: , Rfl:    meloxicam (MOBIC) 7.5 MG tablet, Take 7.5 mg by mouth daily. , Disp: , Rfl:    olmesartan-hydrochlorothiazide (BENICAR HCT) 20-12.5 MG tablet, Take 0.5 tablets by mouth daily., Disp: , Rfl:  pantoprazole (PROTONIX) 40 MG tablet, Take 40 mg by mouth daily. , Disp: , Rfl:    sennosides-docusate sodium (SENOKOT-S) 8.6-50 MG tablet, Take 2 tablets by mouth at bedtime., Disp: , Rfl:    timolol (BETIMOL) 0.5 % ophthalmic solution, Place 1 drop into both eyes 2 (two) times daily., Disp: , Rfl:    Turmeric 500 MG CAPS, Take 1 capsule by mouth daily., Disp: , Rfl:    vitamin B-12 (CYANOCOBALAMIN) 1000 MCG tablet, Take 1,000 mcg by mouth daily. , Disp: , Rfl:   Physical exam:  Vitals:   09/17/22 1359  BP: (!) 154/75  Pulse: (!) 58  Resp: 18  Temp: 97.6 F (36.4 C)  TempSrc: Tympanic  SpO2: 96%  Weight: 159 lb 4.8 oz (72.3 kg)  Height: 5\' 5"  (1.651 m)   Physical Exam Cardiovascular:     Rate and Rhythm: Normal rate and regular rhythm.     Heart sounds: Normal heart sounds.  Pulmonary:     Effort: Pulmonary effort is normal.     Breath sounds: Normal breath sounds.  Skin:    General: Skin is warm and dry.  Neurological:     Mental Status: She is alert and oriented to person, place, and time.    Breast exam was performed in seated and lying down  position. Patient is status post left lumpectomy with a well-healed surgical scar. No evidence of any palpable masses.  She has chronic left nipple inversion.  No evidence of axillary adenopathy. No evidence of any palpable masses or lumps in the right breast. No evidence of right axillary adenopathy      Latest Ref Rng & Units 03/10/2018   11:00 AM  CMP  Glucose 70 - 99 mg/dL 829   BUN 8 - 23 mg/dL 30   Creatinine 5.62 - 1.00 mg/dL 1.30   Sodium 865 - 784 mmol/L 138   Potassium 3.5 - 5.1 mmol/L 4.2   Chloride 98 - 111 mmol/L 104   CO2 22 - 32 mmol/L 27   Calcium 8.9 - 10.3 mg/dL 9.2   Total Protein 6.5 - 8.1 g/dL 7.3   Total Bilirubin 0.3 - 1.2 mg/dL 0.6   Alkaline Phos 38 - 126 U/L 67   AST 15 - 41 U/L 26   ALT 0 - 44 U/L 24       Latest Ref Rng & Units 01/06/2018    1:30 PM  CBC  WBC 4.0 - 10.5 K/uL 3.4   Hemoglobin 12.0 - 15.0 g/dL 69.6   Hematocrit 29.5 - 46.0 % 32.9   Platelets 150 - 400 K/uL 211     Assessment and plan- Patient is a 72 y.o. female with a history of stage I left breast cancer ER/PR positive HER2 negative status postlumpectomy and adjuvant radiation therapy in 2019.  This is a routine follow-up visit  Patient could not tolerate endocrine therapy for her breast cancer which she stopped taking in 2022.  She is now at her 5-year mark of surveillance.  I will see her on a yearly basis at this time. I will Also schedule her mammogram for October 2024.  Clinically she is doing well with no signs and symptoms of recurrence based on today's exam  Patient does have evidence of osteopenia involving the left femur neck and we had discussed oral versus parenteral bisphosphonates.  She would like to hold off on both of these and decide based on her next bone density scan which would be in February 2025  Visit Diagnosis 1. Encounter for follow-up surveillance of breast cancer   2. Osteopenia of neck of left femur      Dr. Owens Shark, MD, MPH [facility] [phone] 09/18/2022 9:18 AM

## 2022-11-19 ENCOUNTER — Ambulatory Visit
Admission: RE | Admit: 2022-11-19 | Discharge: 2022-11-19 | Disposition: A | Discharge status: Home / Self Care | Payer: Medicare HMO | Source: Ambulatory Visit | Attending: Oncology | Admitting: Oncology

## 2022-11-19 DIAGNOSIS — Z08 Encounter for follow-up examination after completed treatment for malignant neoplasm: Secondary | ICD-10-CM | POA: Diagnosis not present

## 2022-11-19 DIAGNOSIS — Z1231 Encounter for screening mammogram for malignant neoplasm of breast: Secondary | ICD-10-CM | POA: Diagnosis present

## 2022-11-19 DIAGNOSIS — Z853 Personal history of malignant neoplasm of breast: Secondary | ICD-10-CM | POA: Insufficient documentation

## 2023-04-08 ENCOUNTER — Ambulatory Visit: Payer: Medicare HMO | Admitting: Oncology

## 2023-04-22 ENCOUNTER — Inpatient Hospital Stay: Payer: Medicare HMO | Attending: Oncology | Admitting: Oncology

## 2023-04-22 ENCOUNTER — Encounter: Payer: Self-pay | Admitting: Oncology

## 2023-04-22 VITALS — BP 128/90 | HR 60 | Temp 98.3°F | Resp 16 | Wt 160.0 lb

## 2023-04-22 DIAGNOSIS — Z08 Encounter for follow-up examination after completed treatment for malignant neoplasm: Secondary | ICD-10-CM | POA: Diagnosis not present

## 2023-04-22 DIAGNOSIS — Z923 Personal history of irradiation: Secondary | ICD-10-CM | POA: Insufficient documentation

## 2023-04-22 DIAGNOSIS — Z853 Personal history of malignant neoplasm of breast: Secondary | ICD-10-CM | POA: Insufficient documentation

## 2023-04-22 DIAGNOSIS — M85852 Other specified disorders of bone density and structure, left thigh: Secondary | ICD-10-CM | POA: Diagnosis not present

## 2023-04-22 NOTE — Progress Notes (Signed)
 Hematology/Oncology Consult note [facility]  Telephone:([phone] [phone]  Patient Care Team: Danella Penton, MD as PCP - General (Internal Medicine) Creig Hines, MD as Consulting Physician (Oncology)   Name of the patient: Megan Briggs  [id number]  Jan 13, 1951   Date of visit: 04/22/23  Diagnosis-history of stage I left breast cancer in 2019  Chief complaint/ Reason for visit-routine follow-up of breast cancer  Heme/Onc history: Patient is a 73 year old seen in the past for normocytic anemia.   Patient had a screening mammogram on 10/16/2017 which showed suspicious mass in the upper outer quadrant of the left breast 6 x 5 x 4 mm in size which was also confirmed on ultrasound.  No evidence of left axillary adenopathy.    Patient underwent lumpectomy and sentinel lymph node biopsy on 11/14/2017.  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  One sentinel lymph node was negative for malignancy.  pT1b pN0.  Given that her tumor was less than 1 cm and grade 1 she did not require any Oncotype testing and adjuvant chemotherapy.  Patient seen by radiation oncology and completed adjuvant radiation.   Patient was started on Arimidex in December 2019.  Baseline bone density scan showed osteopenia with a score of -1.5.  10-year probability of a major osteoporotic fracture was 15% and hip fracture was 2.5%.  Patient reported significant body aches and arthralgias with Arimidex and was switched to letrozole.  Patient was noted to have worsening osteopenia on her bone density scan in February 2022 when patient decided to stop aromatase inhibitors altogether at that time.  She did not wish to switch to tamoxifen either.    Interval history-she is doing well overall and denies any concerns or complaints at this time.  Appetite and weight have remained stable.  Denies any new aches and pains anywhere.  No recent  falls  ECOG PS- 0 Pain scale- 0   Review of systems- Review of Systems  Constitutional:  Negative for chills, fever, malaise/fatigue and weight loss.  HENT:  Negative for congestion, ear discharge and nosebleeds.   Eyes:  Negative for blurred vision.  Respiratory:  Negative for cough, hemoptysis, sputum production, shortness of breath and wheezing.   Cardiovascular:  Negative for chest pain, palpitations, orthopnea and claudication.  Gastrointestinal:  Negative for abdominal pain, blood in stool, constipation, diarrhea, heartburn, melena, nausea and vomiting.  Genitourinary:  Negative for dysuria, flank pain, frequency, hematuria and urgency.  Musculoskeletal:  Negative for back pain, joint pain and myalgias.  Skin:  Negative for rash.  Neurological:  Negative for dizziness, tingling, focal weakness, seizures, weakness and headaches.  Endo/Heme/Allergies:  Does not bruise/bleed easily.  Psychiatric/Behavioral:  Negative for depression and suicidal ideas. The patient does not have insomnia.       Allergies  Allergen Reactions   Ace Inhibitors Anaphylaxis and Swelling    Lisinopril    Tape Other (See Comments)    Band-aids caused redness and irritation Band-aids caused redness and irritation     Past Medical History:  Diagnosis Date   Anemia    Cancer (HCC)    Left breast   Glaucoma    Hypertension    Neuropathy    Personal history of radiation therapy 10/2017   LEFT lumpectomy   Shoulder dislocation    septemeber 2020   Squamous cell carcinoma of skin 03/09/2014   R med clavicle    Squamous cell carcinoma  of skin 02/11/2014   R chest parasternal - SCCIS    Thyroid disease      Past Surgical History:  Procedure Laterality Date   BACK SURGERY     BREAST BIOPSY Right 1995   benign   BREAST BIOPSY Left 11/01/2017   US guided biopsy of mass with distortion - ribbon clip, [facility]   BREAST LUMPECTOMY Left 11/14/2017   INVASIVE MAMMARY CARCINOMA, clear margins, negative  LN   BREAST LUMPECTOMY WITH NEEDLE LOCALIZATION Left 11/14/2017   Procedure: BREAST LUMPECTOMY WITH NEEDLE LOCALIZATION;  Surgeon: Sung Amabile, DO;  Location: [facility];  Service: General;  Laterality: Left;   CATARACT EXTRACTION W/PHACO Left 02/14/2022   Procedure: CATARACT EXTRACTION PHACO AND INTRAOCULAR LENS PLACEMENT (IOC) LEFT;  Surgeon: Lockie Mola, MD;  Location: [facility];  Service: Ophthalmology;  Laterality: Left;  3.03 0:37.7   CATARACT EXTRACTION W/PHACO Right 02/28/2022   Procedure: CATARACT EXTRACTION PHACO AND INTRAOCULAR LENS PLACEMENT (IOC) RIGHT CLAREON TORIC 4.18 32.1;  Surgeon: Lockie Mola, MD;  Location: [facility];  Service: Ophthalmology;  Laterality: Right;   COLONOSCOPY WITH PROPOFOL     SENTINEL NODE BIOPSY Left 11/14/2017   Procedure: SENTINEL NODE BIOPSY;  Surgeon: Sung Amabile, DO;  Location: [facility];  Service: General;  Laterality: Left;    Social History   Socioeconomic History   Marital status: Married    Spouse name: Not on file   Number of children: Not on file   Years of education: Not on file   Highest education level: Not on file  Occupational History   Not on file  Tobacco Use   Smoking status: Former    Current packs/day: 0.00    Types: Cigarettes    Quit date: 06/08/1970    Years since quitting: 52.9   Smokeless tobacco: Never  Vaping Use   Vaping status: Never Used  Substance and Sexual Activity   Alcohol use: Yes    Alcohol/week: 3.0 standard drinks of alcohol    Types: 3 Glasses of wine per week    Comment: 1 glass of wine daily.   Drug use: No   Sexual activity: Yes  Other Topics Concern   Not on file  Social History Narrative   Not on file   Social Drivers of Health   Financial Resource Strain: Low Risk  (11/19/2022)   Received from [facility]   Overall Financial Resource Strain (CARDIA)    Difficulty of Paying Living Expenses: Not hard at all  Food Insecurity: No Food  Insecurity (11/19/2022)   Received from [facility]   Hunger Vital Sign    Worried About Running Out of Food in the Last Year: Never true    Ran Out of Food in the Last Year: Never true  Transportation Needs: No Transportation Needs (11/19/2022)   Received from [facility] - Transportation    In the past 12 months, has lack of transportation kept you from medical appointments or from getting medications?: No    Lack of Transportation (Non-Medical): No  Physical Activity: Not on file  Stress: Not on file  Social Connections: Not on file  Intimate Partner Violence: Not on file    Family History  Problem Relation Age of Onset   Breast cancer Cousin    Breast cancer Cousin    Breast cancer Paternal Aunt      Current Outpatient Medications:    bimatoprost (LUMIGAN) 0.01 % SOLN, Place 1 drop  into both eyes at bedtime., Disp: , Rfl:    Cholecalciferol (VITAMIN D) 2000 units tablet, Take 2,000 Units by mouth daily., Disp: , Rfl:    gabapentin (NEURONTIN) 300 MG capsule, Take 600 mg by mouth at bedtime. , Disp: , Rfl:    levothyroxine (SYNTHROID) 75 MCG tablet, Take 75 mcg by mouth daily before breakfast., Disp: , Rfl:    loratadine (CLARITIN) 10 MG tablet, Take 10 mg by mouth daily., Disp: , Rfl:    meloxicam (MOBIC) 7.5 MG tablet, Take 7.5 mg by mouth daily. , Disp: , Rfl:    olmesartan-hydrochlorothiazide (BENICAR HCT) 20-12.5 MG tablet, Take 0.5 tablets by mouth daily., Disp: , Rfl:    pantoprazole (PROTONIX) 40 MG tablet, Take 40 mg by mouth daily. , Disp: , Rfl:    sennosides-docusate sodium (SENOKOT-S) 8.6-50 MG tablet, Take 2 tablets by mouth at bedtime., Disp: , Rfl:    timolol (BETIMOL) 0.5 % ophthalmic solution, Place 1 drop into both eyes 2 (two) times daily., Disp: , Rfl:    Turmeric 500 MG CAPS, Take 1 capsule by mouth daily., Disp: , Rfl:    vitamin B-12 (CYANOCOBALAMIN) 1000 MCG tablet, Take 1,000 mcg by mouth daily. , Disp: ,  Rfl:   Physical exam:  Vitals:   04/22/23 1306  BP: (!) 128/90  Pulse: 60  Resp: 16  Temp: 98.3 F (36.8 C)  TempSrc: Oral  SpO2: 100%  Weight: 160 lb (72.6 kg)   Physical Exam Cardiovascular:     Rate and Rhythm: Normal rate and regular rhythm.     Heart sounds: Normal heart sounds.  Pulmonary:     Effort: Pulmonary effort is normal.     Breath sounds: Normal breath sounds.  Skin:    General: Skin is warm and dry.  Neurological:     Mental Status: She is alert and oriented to person, place, and time.    Breast exam was performed in seated and lying down position. Patient is status post left lumpectomy with a well-healed surgical scar. No evidence of any palpable masses. No evidence of axillary adenopathy. No evidence of any palpable masses or lumps in the right breast. No evidence of right axillary adenopathy      Latest Ref Rng & Units 03/10/2018   11:00 AM  CMP  Glucose 70 - 99 mg/dL 161   BUN 8 - 23 mg/dL 30   Creatinine 0.96 - 1.00 mg/dL 0.45   Sodium 409 - 811 mmol/L 138   Potassium 3.5 - 5.1 mmol/L 4.2   Chloride 98 - 111 mmol/L 104   CO2 22 - 32 mmol/L 27   Calcium 8.9 - 10.3 mg/dL 9.2   Total Protein 6.5 - 8.1 g/dL 7.3   Total Bilirubin 0.3 - 1.2 mg/dL 0.6   Alkaline Phos 38 - 126 U/L 67   AST 15 - 41 U/L 26   ALT 0 - 44 U/L 24       Latest Ref Rng & Units 01/06/2018    1:30 PM  CBC  WBC 4.0 - 10.5 K/uL 3.4   Hemoglobin 12.0 - 15.0 g/dL 91.4   Hematocrit 78.2 - 46.0 % 32.9   Platelets 150 - 400 K/uL 211    Assessment and plan- Patient is a 73 y.o. female With history of left breast cancer ER/PR positive HER2 negative stage I in 2019 s/p lumpectomy and adjuvant radiation therapy.  She took endocrine therapy for about 18 months and stopped.  She is here for routine  follow-up.  Patient is now more than 5 years out of her diagnosis of breast cancer.  I will see her on a yearly basis.  I will arrange for mammogram for October 2025.  We will plan to repeat  bone density scan in February 2026.  We had discussed previously that her 10-year risk of osteoporotic fracture was more than 20% and hip fracture was more than 3% but patient wanted to hold off on trying any bisphosphonates at this time    Visit Diagnosis 1. Encounter for follow-up surveillance of breast cancer   2. Osteopenia of neck of left femur      Dr. Owens Shark, MD, MPH [facility] [phone] 04/22/2023 3:47 PM

## 2023-06-27 ENCOUNTER — Ambulatory Visit: Payer: Medicare HMO | Admitting: Dermatology

## 2023-08-05 ENCOUNTER — Ambulatory Visit: Admitting: Dermatology

## 2023-08-05 DIAGNOSIS — D229 Melanocytic nevi, unspecified: Secondary | ICD-10-CM

## 2023-08-05 DIAGNOSIS — Z1283 Encounter for screening for malignant neoplasm of skin: Secondary | ICD-10-CM

## 2023-08-05 DIAGNOSIS — L570 Actinic keratosis: Secondary | ICD-10-CM | POA: Diagnosis not present

## 2023-08-05 DIAGNOSIS — L578 Other skin changes due to chronic exposure to nonionizing radiation: Secondary | ICD-10-CM | POA: Diagnosis not present

## 2023-08-05 DIAGNOSIS — L820 Inflamed seborrheic keratosis: Secondary | ICD-10-CM

## 2023-08-05 DIAGNOSIS — Z79899 Other long term (current) drug therapy: Secondary | ICD-10-CM

## 2023-08-05 DIAGNOSIS — L814 Other melanin hyperpigmentation: Secondary | ICD-10-CM

## 2023-08-05 DIAGNOSIS — W908XXA Exposure to other nonionizing radiation, initial encounter: Secondary | ICD-10-CM

## 2023-08-05 DIAGNOSIS — Z7189 Other specified counseling: Secondary | ICD-10-CM

## 2023-08-05 DIAGNOSIS — R21 Rash and other nonspecific skin eruption: Secondary | ICD-10-CM | POA: Diagnosis not present

## 2023-08-05 DIAGNOSIS — Z8589 Personal history of malignant neoplasm of other organs and systems: Secondary | ICD-10-CM

## 2023-08-05 DIAGNOSIS — Z86007 Personal history of in-situ neoplasm of skin: Secondary | ICD-10-CM

## 2023-08-05 MED ORDER — MOMETASONE FUROATE 0.1 % EX CREA: TOPICAL_CREAM | CUTANEOUS | 0 refills | Status: AC

## 2023-08-05 NOTE — Progress Notes (Unsigned)
 Follow-Up Visit   Subjective  Megan Briggs is a 73 y.o. female who presents for the following: Skin Cancer Screening and Full Body Skin Exam  The patient presents for Total-Body Skin Exam (TBSE) for skin cancer screening and mole check. The patient has spots, moles and lesions to be evaluated, some may be new or changing and the patient may have concern these could be cancer.  Hx SCCis. Patient with a few crusty places at forehead, larger indention at right side of nose and is wondering if it could be from her glasses.  The following portions of the chart were reviewed this encounter and updated as appropriate: medications, allergies, medical history  Review of Systems:  No other skin or systemic complaints except as noted in HPI or Assessment and Plan.  Objective  Well appearing patient in no apparent distress; mood and affect are within normal limits.  A full examination was performed including scalp, head, eyes, ears, nose, lips, neck, chest, axillae, abdomen, back, buttocks, bilateral upper extremities, bilateral lower extremities, hands, feet, fingers, toes, fingernails, and toenails. All findings within normal limits unless otherwise noted below.   Relevant physical exam findings are noted in the Assessment and Plan.  R nasal bridge x 1, L chest x 2, R dorsum hand x 1 (4) Erythematous stuck-on, waxy papule or plaque chest x 3 (3) Erythematous thin papules/macules with gritty scale.   Assessment & Plan   SKIN CANCER SCREENING PERFORMED TODAY.  ACTINIC DAMAGE - Chronic condition, secondary to cumulative UV/sun exposure - diffuse scaly erythematous macules with underlying dyspigmentation - Recommend daily broad spectrum sunscreen SPF 30+ to sun-exposed areas, reapply every 2 hours as needed.  - Staying in the shade or wearing long sleeves, sun glasses (UVA+UVB protection) and wide brim hats (4-inch brim around the entire circumference of the hat) are also recommended for  sun protection.  - Call for new or changing lesions.  LENTIGINES, SEBORRHEIC KERATOSES, HEMANGIOMAS - Benign normal skin lesions - Benign-appearing - Call for any changes  MELANOCYTIC NEVI - Tan-brown and/or pink-flesh-colored symmetric macules and papules - Benign appearing on exam today - Observation - Call clinic for new or changing moles - Recommend daily use of broad spectrum spf 30+ sunscreen to sun-exposed areas.   HISTORY OF SQUAMOUS CELL CARCINOMA IN SITU OF THE SKIN - No evidence of recurrence today - Recommend regular full body skin exams - Recommend daily broad spectrum sunscreen SPF 30+ to sun-exposed areas, reapply every 2 hours as needed.  - Call if any new or changing lesions are noted between office visits  Rash Exam: Pink scaly patch at right forehead Differential diagnosis:  ACD vs Irritant Derm vs Nummular Treatment Plan: Start mometasone  cr twice daily until smooth. Avoid applying to face, groin, and axilla. Use as directed. Long-term use can cause thinning of the skin.     INFLAMED SEBORRHEIC KERATOSIS (4) R nasal bridge x 1, L chest x 2, R dorsum hand x 1 (4) Symptomatic, irritating, patient would like treated.  Benign-appearing.  Call clinic for new or changing lesions.   Destruction of lesion - R nasal bridge x 1, L chest x 2, R dorsum hand x 1 (4) Complexity: simple   Destruction method: cryotherapy   Informed consent: discussed and consent obtained   Timeout:  patient name, date of birth, surgical site, and procedure verified Lesion destroyed using liquid nitrogen: Yes   Region frozen until ice ball extended beyond lesion: Yes   Outcome: patient tolerated procedure  well with no complications   Post-procedure details: wound care instructions given    AK (ACTINIC KERATOSIS) (3) chest x 3 (3) Actinic keratoses are precancerous spots that appear secondary to cumulative UV radiation exposure/sun exposure over time. They are chronic with expected  duration over 1 year. A portion of actinic keratoses will progress to squamous cell carcinoma of the skin. It is not possible to reliably predict which spots will progress to skin cancer and so treatment is recommended to prevent development of skin cancer.  Recommend daily broad spectrum sunscreen SPF 30+ to sun-exposed areas, reapply every 2 hours as needed.  Recommend staying in the shade or wearing long sleeves, sun glasses (UVA+UVB protection) and wide brim hats (4-inch brim around the entire circumference of the hat). Call for new or changing lesions. Destruction of lesion - chest x 3 (3) Complexity: simple   Destruction method: cryotherapy   Informed consent: discussed and consent obtained   Timeout:  patient name, date of birth, surgical site, and procedure verified Lesion destroyed using liquid nitrogen: Yes   Region frozen until ice ball extended beyond lesion: Yes   Outcome: patient tolerated procedure well with no complications   Post-procedure details: wound care instructions given    Return in about 1 year (around 08/04/2024) for TBSE, with Dr. MARLA, HxSCCis.  LILLETTE Lonell Drones, RMA, am acting as scribe for Alm Rhyme, MD .   Documentation: I have reviewed the above documentation for accuracy and completeness, and I agree with the above.  Alm Rhyme, MD

## 2023-08-05 NOTE — Patient Instructions (Signed)

## 2023-08-06 ENCOUNTER — Encounter: Payer: Self-pay | Admitting: Dermatology

## 2023-11-20 ENCOUNTER — Ambulatory Visit
Admission: RE | Admit: 2023-11-20 | Discharge: 2023-11-20 | Disposition: A | Discharge status: Home / Self Care | Source: Ambulatory Visit | Attending: Oncology | Admitting: Oncology

## 2023-11-20 ENCOUNTER — Telehealth: Payer: Self-pay | Admitting: *Deleted

## 2023-11-20 DIAGNOSIS — Z1231 Encounter for screening mammogram for malignant neoplasm of breast: Secondary | ICD-10-CM | POA: Diagnosis not present

## 2023-11-20 DIAGNOSIS — Z853 Personal history of malignant neoplasm of breast: Secondary | ICD-10-CM | POA: Diagnosis present

## 2023-11-20 DIAGNOSIS — Z08 Encounter for follow-up examination after completed treatment for malignant neoplasm: Secondary | ICD-10-CM | POA: Diagnosis present

## 2023-11-20 DIAGNOSIS — M85852 Other specified disorders of bone density and structure, left thigh: Secondary | ICD-10-CM | POA: Diagnosis present

## 2023-11-20 NOTE — Telephone Encounter (Signed)
 Patient had her mammogram today and she was wondering when she was supposed to be getting a bone density.  I told her that there is a order in there for February 2026.  So I told her about January to make sure to start calling and asked for a date in the February 2026.  He knows that she gets it over at [facility].

## 2023-11-22 LAB — COLOGUARD: COLOGUARD: NEGATIVE

## 2023-11-28 ENCOUNTER — Other Ambulatory Visit: Payer: Self-pay | Admitting: Internal Medicine

## 2023-11-28 DIAGNOSIS — E782 Mixed hyperlipidemia: Secondary | ICD-10-CM

## 2023-11-28 DIAGNOSIS — Z0000 Encounter for general adult medical examination without abnormal findings: Secondary | ICD-10-CM

## 2023-12-05 ENCOUNTER — Ambulatory Visit
Admission: RE | Admit: 2023-12-05 | Discharge: 2023-12-05 | Disposition: A | Discharge status: Home / Self Care | Payer: Self-pay | Source: Ambulatory Visit | Attending: Internal Medicine | Admitting: Internal Medicine

## 2023-12-05 DIAGNOSIS — E782 Mixed hyperlipidemia: Secondary | ICD-10-CM | POA: Insufficient documentation

## 2023-12-05 DIAGNOSIS — Z0000 Encounter for general adult medical examination without abnormal findings: Secondary | ICD-10-CM | POA: Insufficient documentation

## 2024-01-31 ENCOUNTER — Encounter: Payer: Self-pay | Admitting: Ophthalmology

## 2024-02-05 ENCOUNTER — Other Ambulatory Visit: Payer: Self-pay

## 2024-02-05 ENCOUNTER — Encounter: Payer: Self-pay | Admitting: Ophthalmology

## 2024-02-05 ENCOUNTER — Ambulatory Visit
Admission: RE | Admit: 2024-02-05 | Discharge: 2024-02-05 | Disposition: A | Discharge status: Home / Self Care | Attending: Ophthalmology | Admitting: Ophthalmology

## 2024-02-05 ENCOUNTER — Ambulatory Visit: Payer: Self-pay | Admitting: General Practice

## 2024-02-05 ENCOUNTER — Encounter: Payer: Self-pay | Admitting: General Practice

## 2024-02-05 ENCOUNTER — Encounter
Admission: RE | Disposition: A | Discharge status: Home / Self Care | Payer: Self-pay | Source: Home / Self Care | Attending: Ophthalmology

## 2024-02-05 DIAGNOSIS — H401113 Primary open-angle glaucoma, right eye, severe stage: Secondary | ICD-10-CM | POA: Diagnosis present

## 2024-02-05 DIAGNOSIS — K219 Gastro-esophageal reflux disease without esophagitis: Secondary | ICD-10-CM | POA: Insufficient documentation

## 2024-02-05 DIAGNOSIS — Z87891 Personal history of nicotine dependence: Secondary | ICD-10-CM | POA: Insufficient documentation

## 2024-02-05 DIAGNOSIS — M1990 Unspecified osteoarthritis, unspecified site: Secondary | ICD-10-CM | POA: Diagnosis not present

## 2024-02-05 DIAGNOSIS — E039 Hypothyroidism, unspecified: Secondary | ICD-10-CM | POA: Diagnosis not present

## 2024-02-05 DIAGNOSIS — I10 Essential (primary) hypertension: Secondary | ICD-10-CM | POA: Diagnosis not present

## 2024-02-05 HISTORY — DX: Unspecified osteoarthritis, unspecified site: M19.90

## 2024-02-05 HISTORY — DX: Hypothyroidism, unspecified: E03.9

## 2024-02-05 HISTORY — PX: GONIOTOMY: SHX7582

## 2024-02-05 HISTORY — DX: Gastro-esophageal reflux disease without esophagitis: K21.9

## 2024-02-05 SURGERY — GONIOTOMY
Anesthesia: Topical | Site: Eye | Laterality: Right

## 2024-02-05 MED ORDER — MIDAZOLAM HCL 2 MG/2ML IJ SOLN
INTRAMUSCULAR | Status: AC
  Filled 2024-02-05: qty 2

## 2024-02-05 MED ORDER — MIDAZOLAM HCL (PF) 2 MG/2ML IJ SOLN
INTRAMUSCULAR | Status: DC | PRN
  Administered 2024-02-05 (×2): 1 mg via INTRAVENOUS

## 2024-02-05 MED ORDER — FENTANYL CITRATE (PF) 100 MCG/2ML IJ SOLN
INTRAMUSCULAR | Status: DC | PRN
  Administered 2024-02-05: 50 ug via INTRAVENOUS

## 2024-02-05 MED ORDER — TETRACAINE HCL 0.5 % OP SOLN
1.0000 [drp] | OPHTHALMIC | Status: AC | PRN
  Administered 2024-02-05: 1 [drp] via OPHTHALMIC

## 2024-02-05 MED ORDER — FENTANYL CITRATE (PF) 100 MCG/2ML IJ SOLN
INTRAMUSCULAR | Status: AC
  Filled 2024-02-05: qty 2

## 2024-02-05 MED ORDER — SIGHTPATH DOSE#1 NA CHONDROIT SULF-NA HYALURON 20-15 MG/0.5ML IO SOSY
INTRAOCULAR | Status: DC | PRN
  Administered 2024-02-05: .5 mL via INTRAOCULAR

## 2024-02-05 MED ORDER — LIDOCAINE HCL (PF) 2 % IJ SOLN
INTRAOCULAR | Status: DC | PRN
  Administered 2024-02-05: 2 mL

## 2024-02-05 MED ORDER — SIGHTPATH DOSE#1 BSS IO SOLN
INTRAOCULAR | Status: DC | PRN
  Administered 2024-02-05: 26 mL via OPHTHALMIC

## 2024-02-05 MED ORDER — SIGHTPATH DOSE#1 BSS IO SOLN
INTRAOCULAR | Status: DC | PRN
  Administered 2024-02-05: 15 mL via INTRAOCULAR

## 2024-02-05 MED ORDER — CEFUROXIME OPHTHALMIC INJECTION 1 MG/0.1 ML
INJECTION | OPHTHALMIC | Status: DC | PRN
  Administered 2024-02-05: .1 mL via INTRACAMERAL

## 2024-02-05 MED ORDER — TETRACAINE HCL 0.5 % OP SOLN
OPHTHALMIC | Status: AC
  Filled 2024-02-05: qty 4

## 2024-02-05 SURGICAL SUPPLY — 7 items
BLADE DUAL KAHOOK SINGLE USE (BLADE) ×1 IMPLANT
FEE CATARACT SUITE SIGHTPATH (MISCELLANEOUS) ×1 IMPLANT
GLOVE BIOGEL PI IND STRL 8 (GLOVE) ×1 IMPLANT
GLOVE SURG LX STRL 7.5 STRW (GLOVE) ×1 IMPLANT
GLOVE SURG SYN 6.5 PF PI BL (GLOVE) ×1 IMPLANT
NEEDLE FILTER BLUNT 18X1 1/2 (NEEDLE) ×1 IMPLANT
SYR 3ML LL SCALE MARK (SYRINGE) ×1 IMPLANT

## 2024-02-05 NOTE — Anesthesia Preprocedure Evaluation (Signed)
"                                    Anesthesia Evaluation  Patient identified by MRN, date of birth, ID band Patient awake    Reviewed: Allergy & Precautions, H&P , NPO status , Patient's Chart, lab work & pertinent test results  Airway Mallampati: II  TM Distance: >3 FB Neck ROM: Full    Dental no notable dental hx.    Pulmonary neg pulmonary ROS, former smoker   Pulmonary exam normal breath sounds clear to auscultation       Cardiovascular hypertension, negative cardio ROS Normal cardiovascular exam Rhythm:Regular Rate:Normal     Neuro/Psych negative neurological ROS  negative psych ROS   GI/Hepatic negative GI ROS, Neg liver ROS,GERD  ,,  Endo/Other  negative endocrine ROSHypothyroidism    Renal/GU Renal diseasenegative Renal ROS  negative genitourinary   Musculoskeletal negative musculoskeletal ROS (+) Arthritis ,    Abdominal   Peds negative pediatric ROS (+)  Hematology negative hematology ROS (+) Blood dyscrasia, anemia   Anesthesia Other Findings   Reproductive/Obstetrics negative OB ROS                              Anesthesia Physical Anesthesia Plan  ASA: 2  Anesthesia Plan:    Post-op Pain Management: Minimal or no pain anticipated   Induction: Intravenous  PONV Risk Score and Plan: 0  Airway Management Planned: Nasal Cannula  Additional Equipment:   Intra-op Plan:   Post-operative Plan:   Informed Consent: I have reviewed the patients History and Physical, chart, labs and discussed the procedure including the risks, benefits and alternatives for the proposed anesthesia with the patient or authorized representative who has indicated his/her understanding and acceptance.       Plan Discussed with: CRNA  Anesthesia Plan Comments:         Anesthesia Quick Evaluation  "

## 2024-02-05 NOTE — Op Note (Signed)
 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    COMPLICATIONS:  None.   DESCRIPTION OF PROCEDURE:  The patient was identified in the holding room and transported to the operating room and placed in the supine position under the operating microscope.  The right eye was identified as the operative eye and it was prepped and draped in the usual sterile ophthalmic fashion.   A 1 millimeter clear-corneal paracentesis was made at the 12:00 position.  0.5 ml of preservative-free 1% lidocaine  was injected into the anterior chamber.  The anterior chamber was filled with Viscoat viscoelastic.  A 2.4 millimeter keratome was used to make a near-clear corneal incision at the 9:00 position. The microscope was adjusted and a gonioprism was used to visulaize the trabecular meshwork.  The [facility] Dual Blade was advanced across the anterior chamber under viscoelastic.  The blade was used to mark the trabecular meshwork at the 1:30 position.  The blade was placed two clock hours clockwise into the meshwork.  Proper postioning was confirmed.  The blade ws passed counterclockwise through the meshwork to excise approximately two to three clock-hours of trabecular meshwork.  The remaining viscoelastic was aspirated.   Wounds were hydrated with balanced salt solution.  The anterior chamber was inflated to a physiologic pressure with balanced salt solution.  No wound leaks were noted. Cefuroxime  0.1 ml of a 10mg /ml solution was injected into the anterior chamber for a dose of 1 mg of intracameral antibiotic at the completion of the case.  The patient was taken to the recovery room in stable condition  without complications of anesthesia or surgery.

## 2024-02-05 NOTE — Transfer of Care (Signed)
 Immediate Anesthesia Transfer of Care Note  Patient: Megan Briggs  Procedure(s) Performed: GONIOTOMY (Right: Eye)  Patient Location: PACU  Anesthesia Type: No value filed.  Level of Consciousness: awake, alert  and patient cooperative  Airway and Oxygen Therapy: Patient Spontanous Breathing   Post-op Assessment: Post-op Vital signs reviewed, Patient's Cardiovascular Status Stable, Respiratory Function Stable, Patent Airway and No signs of Nausea or vomiting  Post-op Vital Signs: Reviewed and stable  Complications: No notable events documented.

## 2024-02-05 NOTE — H&P (Signed)
 " [facility]   Primary Care Physician:  Cleotilde Oneil FALCON, MD Ophthalmologist: Dr. Dene Etienne  Pre-Procedure History & Physical: HPI:  Megan Briggs is a 75 y.o. female here for ophthalmic surgery.   Past Medical History:  Diagnosis Date   Anemia    Arthritis    Cancer (HCC)    Left breast   GERD (gastroesophageal reflux disease)    Glaucoma    Hypertension    Hypothyroidism    Neuropathy    Personal history of radiation therapy 10/2017   LEFT lumpectomy   Shoulder dislocation    septemeber 2020   Squamous cell carcinoma of skin 03/09/2014   R med clavicle    Squamous cell carcinoma of skin 02/11/2014   R chest parasternal - SCCIS    Thyroid  disease     Past Surgical History:  Procedure Laterality Date   BACK SURGERY     BREAST BIOPSY Right 1995   benign   BREAST BIOPSY Left 11/01/2017   US  guided biopsy of mass with distortion - ribbon clip, [facility]   BREAST LUMPECTOMY Left 11/14/2017   INVASIVE MAMMARY CARCINOMA, clear margins, negative LN   BREAST LUMPECTOMY WITH NEEDLE LOCALIZATION Left 11/14/2017   Procedure: BREAST LUMPECTOMY WITH NEEDLE LOCALIZATION;  Surgeon: Tye Millet, DO;  Location: [facility] ORS;  Service: General;  Laterality: Left;   CATARACT EXTRACTION W/PHACO Left 02/14/2022   Procedure: CATARACT EXTRACTION PHACO AND INTRAOCULAR LENS PLACEMENT (IOC) LEFT;  Surgeon: Etienne Dene, MD;  Location: [facility];  Service: Ophthalmology;  Laterality: Left;  3.03 0:37.7   CATARACT EXTRACTION W/PHACO Right 02/28/2022   Procedure: CATARACT EXTRACTION PHACO AND INTRAOCULAR LENS PLACEMENT (IOC) RIGHT CLAREON TORIC 4.18 32.1;  Surgeon: Etienne Dene, MD;  Location: [facility];  Service: Ophthalmology;  Laterality: Right;   COLONOSCOPY WITH PROPOFOL      SENTINEL NODE BIOPSY Left 11/14/2017   Procedure: SENTINEL NODE BIOPSY;  Surgeon: Tye Millet, DO;  Location: [facility];  Service: General;  Laterality: Left;    Prior to  Admission medications  Medication Sig Start Date End Date Taking? Authorizing Provider  bimatoprost (LUMIGAN) 0.01 % SOLN Place 1 drop into both eyes at bedtime.   Yes [provider]  Cholecalciferol (VITAMIN D ) 2000 units tablet Take 2,000 Units by mouth daily.   Yes [provider]  gabapentin (NEURONTIN) 300 MG capsule Take 600 mg by mouth at bedtime.  01/31/16  Yes [provider]  levothyroxine (SYNTHROID) 75 MCG tablet Take 75 mcg by mouth daily before breakfast.   Yes [provider]  loratadine (CLARITIN) 10 MG tablet Take 10 mg by mouth daily.   Yes [provider]  meloxicam (MOBIC) 7.5 MG tablet Take 7.5 mg by mouth daily.  04/22/16  Yes [provider]  mometasone  (ELOCON ) 0.1 % cream Apply to aa right forehead twice daily until clear. Avoid applying to face, groin, and axilla. Use as directed. Long-term use can cause thinning of the skin. Patient taking differently: as needed. Apply to aa right forehead twice daily until clear. Avoid applying to face, groin, and axilla. Use as directed. Long-term use can cause thinning of the skin. 08/05/23  Yes Hester Alm BROCKS, MD  olmesartan-hydrochlorothiazide (BENICAR HCT) 20-12.5 MG tablet Take 0.5 tablets by mouth daily.   Yes [provider]  pantoprazole (PROTONIX) 40 MG tablet Take 40 mg by mouth daily.  04/22/16  Yes [provider]  sennosides-docusate sodium  (SENOKOT-S) 8.6-50 MG tablet Take 2 tablets by mouth  at bedtime.   Yes [provider]  timolol  (BETIMOL ) 0.5 % ophthalmic solution Place 1 drop into both eyes 2 (two) times daily.   Yes [provider]  Turmeric 500 MG CAPS Take 1 capsule by mouth daily.   Yes [provider]  vitamin B-12 (CYANOCOBALAMIN ) 1000 MCG tablet Take 1,000 mcg by mouth daily.  03/03/16  Yes [provider]    Allergies as of 12/20/2023 - Review Complete 08/06/2023  Allergen Reaction Noted   Ace inhibitors  Anaphylaxis and Swelling 05/24/2017   Tape Other (See Comments) 11/08/2017    Family History  Problem Relation Age of Onset   Breast cancer Cousin    Breast cancer Cousin    Breast cancer Paternal Aunt     Social History   Socioeconomic History   Marital status: Married    Spouse name: Not on file   Number of children: Not on file   Years of education: Not on file   Highest education level: Not on file  Occupational History   Not on file  Tobacco Use   Smoking status: Former    Current packs/day: 0.00    Average packs/day: 0.3 packs/day    Types: Cigarettes    Quit date: 06/08/1970    Years since quitting: 53.6   Smokeless tobacco: Never  Vaping Use   Vaping status: Never Used  Substance and Sexual Activity   Alcohol use: Yes    Alcohol/week: 1.0 standard drink of alcohol    Types: 1 Glasses of wine per week    Comment: 1 glass of wine daily.   Drug use: No   Sexual activity: Yes  Other Topics Concern   Not on file  Social History Narrative   Not on file   Social Drivers of Health   Tobacco Use: Medium Risk (01/31/2024)   Patient History    Smoking Tobacco Use: Former    Smokeless Tobacco Use: Never    Passive Exposure: Not on file  Financial Resource Strain: Low Risk  (11/27/2023)   Received from [facility]   Overall Financial Resource Strain (CARDIA)    Difficulty of Paying Living Expenses: Not hard at all  Food Insecurity: No Food Insecurity (11/27/2023)   Received from [facility]   Epic    Within the past 12 months, you worried that your food would run out before you got the money to buy more.: Never true    Within the past 12 months, the food you bought just didn't last and you didn't have money to get more.: Never true  Transportation Needs: No Transportation Needs (11/27/2023)   Received from [facility] - Transportation    In the past 12 months, has lack of transportation kept you from  medical appointments or from getting medications?: No    Lack of Transportation (Non-Medical): No  Physical Activity: Not on file  Stress: Not on file  Social Connections: Not on file  Intimate Partner Violence: Not on file  Depression (EYV7-0): Not on file  Alcohol Screen: Not on file  Housing: Low Risk  (11/27/2023)   Received from [facility]   Epic    In the last 12 months, was there a time when you were not able to pay the mortgage or rent on time?: No    In the past 12 months, how many times have you moved where you were living?: 0    At any time  in the past 12 months, were you homeless or living in a shelter (including now)?: No  Utilities: Not At Risk (11/27/2023)   Received from [facility]   Epic    In the past 12 months has the electric, gas, oil, or water company threatened to shut off services in your home?: No  Health Literacy: Not on file    Review of Systems: See HPI, otherwise negative ROS  Physical Exam: Ht 5' 4 (1.626 m)   Wt 70.3 kg   BMI 26.61 kg/m  General:   Alert,  pleasant and cooperative in NAD Head:  Normocephalic and atraumatic. Lungs:  Clear to auscultation.    Heart:  Regular rate and rhythm.   Impression/Plan: Megan Briggs is here for ophthalmic surgery.  Risks, benefits, limitations, and alternatives regarding ophthalmic surgery have been reviewed with the patient.  Questions have been answered.  All parties agreeable.   Megan GASKIN, MD  02/05/2024, 8:53 AM  "

## 2024-02-05 NOTE — Anesthesia Postprocedure Evaluation (Signed)
"   Anesthesia Post Note  Patient: Megan Briggs  Procedure(s) Performed: GONIOTOMY (Right: Eye)  Anesthetic complications: no   No notable events documented.   Last Vitals:  Vitals:   02/05/24 0936 02/05/24 0942  BP: 133/68 136/65  Pulse: (!) 53 (!) 52  Resp: 18 11  Temp: (!) 36.2 C 36.7 C  SpO2: 96% 95%    Last Pain:  Vitals:   02/05/24 0942  TempSrc:   PainSc: 0-No pain                 Redell MARLA Breaker      "

## 2024-02-12 ENCOUNTER — Encounter: Payer: Self-pay | Admitting: Ophthalmology

## 2024-02-17 NOTE — Discharge Instructions (Signed)

## 2024-02-19 ENCOUNTER — Encounter: Payer: Self-pay | Admitting: Ophthalmology

## 2024-02-19 ENCOUNTER — Ambulatory Visit
Admission: RE | Admit: 2024-02-19 | Discharge: 2024-02-19 | Disposition: A | Discharge status: Home / Self Care | Attending: Ophthalmology | Admitting: Ophthalmology

## 2024-02-19 ENCOUNTER — Ambulatory Visit: Payer: Self-pay

## 2024-02-19 ENCOUNTER — Other Ambulatory Visit: Payer: Self-pay

## 2024-02-19 ENCOUNTER — Encounter
Admission: RE | Disposition: A | Discharge status: Home / Self Care | Payer: Self-pay | Source: Home / Self Care | Attending: Ophthalmology

## 2024-02-19 DIAGNOSIS — I10 Essential (primary) hypertension: Secondary | ICD-10-CM | POA: Insufficient documentation

## 2024-02-19 DIAGNOSIS — Z87891 Personal history of nicotine dependence: Secondary | ICD-10-CM | POA: Diagnosis not present

## 2024-02-19 DIAGNOSIS — H401122 Primary open-angle glaucoma, left eye, moderate stage: Secondary | ICD-10-CM | POA: Diagnosis present

## 2024-02-19 HISTORY — PX: GONIOTOMY: SHX7582

## 2024-02-19 MED ORDER — CYCLOPENTOLATE HCL 2 % OP SOLN
OPHTHALMIC | Status: AC
  Filled 2024-02-19: qty 2

## 2024-02-19 MED ORDER — TETRACAINE 0.5 % OP SOLN OPTIME - NO CHARGE
OPHTHALMIC | Status: DC | PRN
  Administered 2024-02-19: 2 [drp] via OPHTHALMIC

## 2024-02-19 MED ORDER — ACETAMINOPHEN 500 MG PO TABS
ORAL_TABLET | ORAL | Status: AC
  Filled 2024-02-19: qty 2

## 2024-02-19 MED ORDER — CEFUROXIME OPHTHALMIC INJECTION 1 MG/0.1 ML
INJECTION | OPHTHALMIC | Status: DC | PRN
  Administered 2024-02-19: 1 mg via INTRACAMERAL

## 2024-02-19 MED ORDER — MIDAZOLAM HCL (PF) 2 MG/2ML IJ SOLN
INTRAMUSCULAR | Status: DC | PRN
  Administered 2024-02-19: .5 mg via INTRAVENOUS
  Administered 2024-02-19: 1.5 mg via INTRAVENOUS

## 2024-02-19 MED ORDER — TETRACAINE HCL 0.5 % OP SOLN
1.0000 [drp] | OPHTHALMIC | Status: AC | PRN
  Administered 2024-02-19: 1 [drp] via OPHTHALMIC

## 2024-02-19 MED ORDER — PHENYLEPHRINE HCL 10 % OP SOLN
OPHTHALMIC | Status: AC
  Filled 2024-02-19: qty 5

## 2024-02-19 MED ORDER — LIDOCAINE HCL (PF) 2 % IJ SOLN
INTRAOCULAR | Status: DC | PRN
  Administered 2024-02-19: 1 mL via INTRAMUSCULAR

## 2024-02-19 MED ORDER — ACETAMINOPHEN 500 MG PO TABS
1000.0000 mg | ORAL_TABLET | Freq: Four times a day (QID) | ORAL | Status: DC | PRN
  Administered 2024-02-19: 1000 mg via ORAL

## 2024-02-19 MED ORDER — SIGHTPATH DOSE#1 BSS IO SOLN
INTRAOCULAR | Status: DC | PRN
  Administered 2024-02-19: 15 mL

## 2024-02-19 MED ORDER — NEOMYCIN-POLYMYXIN-DEXAMETH 3.5-10000-0.1 OP OINT
TOPICAL_OINTMENT | OPHTHALMIC | Status: DC | PRN
  Administered 2024-02-19: 1 via OPHTHALMIC

## 2024-02-19 MED ORDER — MIDAZOLAM HCL 2 MG/2ML IJ SOLN
INTRAMUSCULAR | Status: AC
  Filled 2024-02-19: qty 2

## 2024-02-19 MED ORDER — LACTATED RINGERS IV SOLN: INTRAVENOUS | Status: DC

## 2024-02-19 MED ORDER — TETRACAINE HCL 0.5 % OP SOLN
OPHTHALMIC | Status: AC
  Filled 2024-02-19: qty 4

## 2024-02-19 NOTE — Op Note (Signed)
 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    COMPLICATIONS:  None.   DESCRIPTION OF PROCEDURE:  The patient was identified in the holding room and transported to the operating room and placed in the supine position under the operating microscope.  The left eye was identified as the operative eye and it was prepped and draped in the usual sterile ophthalmic fashion.   A 1 millimeter clear-corneal paracentesis was made at the 5:00 position.  0.5 ml of preservative-free 1% lidocaine  was injected into the anterior chamber.  The anterior chamber was filled with Viscoat viscoelastic.  A 2.4 millimeter keratome was used to make a near-clear corneal incision at the 3:00 position. The microscope was adjusted and a gonioprism was used to visulaize the trabecular meshwork.  The [facility] Dual Blade was advanced across the anterior chamber under viscoelastic.  The blade was used to mark the trabecular meshwork at the 1:30 position.  The blade was placed two clock hours clockwise into the meshwork.  Proper postioning was confirmed.  The blade ws passed counterclockwise through the meshwork to excise approximately two to three clock-hours of trabecular meshwork.  The remaining viscoelastic was aspirated.   Wounds were hydrated with balanced salt solution.  The anterior chamber was inflated to a physiologic pressure with balanced salt solution.  No wound leaks were noted. Cefuroxime  0.1 ml of a 10mg /ml solution was injected into the anterior chamber for a dose of 1 mg of intracameral antibiotic at the completion of the case. Maxitrol  ointment was placed on the eye. The patient was taken to the recovery room in  stable condition without complications of anesthesia or surgery.

## 2024-02-19 NOTE — Anesthesia Postprocedure Evaluation (Signed)
"   Anesthesia Post Note  Patient: Megan Briggs  Procedure(s) Performed: GONIOTOMY (Left: Eye)  Patient location during evaluation: PACU Anesthesia Type: MAC Level of consciousness: awake and alert Pain management: pain level controlled Vital Signs Assessment: post-procedure vital signs reviewed and stable Respiratory status: spontaneous breathing, nonlabored ventilation, respiratory function stable and patient connected to nasal cannula oxygen Cardiovascular status: blood pressure returned to baseline and stable Postop Assessment: no apparent nausea or vomiting Anesthetic complications: no   No notable events documented.   Last Vitals:  Vitals:   02/19/24 0922 02/19/24 1011  BP: 138/68 137/75  Pulse: (!) 50 (!) 53  Resp: 13 18  Temp: 36.9 C (!) 36.2 C  SpO2: 96% 96%    Last Pain:  Vitals:   02/19/24 1011  TempSrc:   PainSc: 2                  Fairy A Kailen Name      "

## 2024-02-19 NOTE — H&P (Signed)
 " [facility]   Primary Care Physician:  Cleotilde Oneil FALCON, MD Ophthalmologist: Dr. Dene Etienne  Pre-Procedure History & Physical: HPI:  Megan Briggs is a 74 y.o. female here for ophthalmic surgery.   Past Medical History:  Diagnosis Date   Anemia    Arthritis    Cancer (HCC)    Left breast   GERD (gastroesophageal reflux disease)    Glaucoma    Hypertension    Hypothyroidism    Neuropathy    Personal history of radiation therapy 10/2017   LEFT lumpectomy   Shoulder dislocation    septemeber 2020   Squamous cell carcinoma of skin 03/09/2014   R med clavicle    Squamous cell carcinoma of skin 02/11/2014   R chest parasternal - SCCIS    Thyroid  disease     Past Surgical History:  Procedure Laterality Date   BACK SURGERY     BREAST BIOPSY Right 1995   benign   BREAST BIOPSY Left 11/01/2017   US  guided biopsy of mass with distortion - ribbon clip, [facility]   BREAST LUMPECTOMY Left 11/14/2017   INVASIVE MAMMARY CARCINOMA, clear margins, negative LN   BREAST LUMPECTOMY WITH NEEDLE LOCALIZATION Left 11/14/2017   Procedure: BREAST LUMPECTOMY WITH NEEDLE LOCALIZATION;  Surgeon: Tye Millet, DO;  Location: [facility];  Service: General;  Laterality: Left;   CATARACT EXTRACTION W/PHACO Left 02/14/2022   Procedure: CATARACT EXTRACTION PHACO AND INTRAOCULAR LENS PLACEMENT (IOC) LEFT;  Surgeon: Etienne Dene, MD;  Location: [facility];  Service: Ophthalmology;  Laterality: Left;  3.03 0:37.7   CATARACT EXTRACTION W/PHACO Right 02/28/2022   Procedure: CATARACT EXTRACTION PHACO AND INTRAOCULAR LENS PLACEMENT (IOC) RIGHT CLAREON TORIC 4.18 32.1;  Surgeon: Etienne Dene, MD;  Location: [facility];  Service: Ophthalmology;  Laterality: Right;   COLONOSCOPY WITH PROPOFOL      GONIOTOMY Right 02/05/2024   Procedure: GONIOTOMY;  Surgeon: Etienne Dene, MD;  Location: [facility];  Service: Ophthalmology;  Laterality: Right;   SENTINEL  NODE BIOPSY Left 11/14/2017   Procedure: SENTINEL NODE BIOPSY;  Surgeon: Tye Millet, DO;  Location: [facility];  Service: General;  Laterality: Left;    Prior to Admission medications  Medication Sig Start Date End Date Taking? Authorizing Provider  bimatoprost (LUMIGAN) 0.01 % SOLN Place 1 drop into both eyes at bedtime.   Yes [provider]  Cholecalciferol (VITAMIN D ) 2000 units tablet Take 2,000 Units by mouth daily.   Yes [provider]  gabapentin (NEURONTIN) 300 MG capsule Take 600 mg by mouth at bedtime.  01/31/16  Yes [provider]  levothyroxine (SYNTHROID) 75 MCG tablet Take 75 mcg by mouth daily before breakfast.   Yes [provider]  loratadine (CLARITIN) 10 MG tablet Take 10 mg by mouth daily.   Yes [provider]  meloxicam (MOBIC) 7.5 MG tablet Take 7.5 mg by mouth daily.  04/22/16  Yes [provider]  mometasone  (ELOCON ) 0.1 % cream Apply to aa right forehead twice daily until clear. Avoid applying to face, groin, and axilla. Use as directed. Long-term use can cause thinning of the skin. Patient taking differently: as needed. Apply to aa right forehead twice daily until clear. Avoid applying to face, groin, and axilla. Use as directed. Long-term use can cause thinning of the skin. 08/05/23  Yes Hester Alm BROCKS, MD  olmesartan-hydrochlorothiazide (BENICAR HCT) 20-12.5 MG tablet Take 0.5 tablets by mouth daily.   Yes [provider]  pantoprazole (PROTONIX) 40 MG tablet  Take 40 mg by mouth daily.  04/22/16  Yes [provider]  sennosides-docusate sodium  (SENOKOT-S) 8.6-50 MG tablet Take 2 tablets by mouth at bedtime.   Yes [provider]  timolol  (BETIMOL ) 0.5 % ophthalmic solution Place 1 drop into both eyes 2 (two) times daily.   Yes [provider]  Turmeric 500 MG CAPS Take 1 capsule by mouth daily.   Yes [provider]  vitamin B-12 (CYANOCOBALAMIN ) 1000 MCG tablet Take  1,000 mcg by mouth daily.  03/03/16  Yes [provider]    Allergies as of 12/20/2023 - Review Complete 08/06/2023  Allergen Reaction Noted   Ace inhibitors Anaphylaxis and Swelling 05/24/2017   Tape Other (See Comments) 11/08/2017    Family History  Problem Relation Age of Onset   Breast cancer Cousin    Breast cancer Cousin    Breast cancer Paternal Aunt     Social History   Socioeconomic History   Marital status: Married    Spouse name: Not on file   Number of children: Not on file   Years of education: Not on file   Highest education level: Not on file  Occupational History   Not on file  Tobacco Use   Smoking status: Former    Current packs/day: 0.00    Average packs/day: 0.3 packs/day    Types: Cigarettes    Quit date: 06/08/1970    Years since quitting: 53.7   Smokeless tobacco: Never  Vaping Use   Vaping status: Never Used  Substance and Sexual Activity   Alcohol use: Yes    Alcohol/week: 1.0 standard drink of alcohol    Types: 1 Glasses of wine per week    Comment: 1 glass of wine daily.   Drug use: No   Sexual activity: Yes  Other Topics Concern   Not on file  Social History Narrative   Not on file   Social Drivers of Health   Tobacco Use: Medium Risk (02/12/2024)   Patient History    Smoking Tobacco Use: Former    Smokeless Tobacco Use: Never    Passive Exposure: Not on file  Financial Resource Strain: Low Risk  (11/27/2023)   Received from [facility]   Overall Financial Resource Strain (CARDIA)    Difficulty of Paying Living Expenses: Not hard at all  Food Insecurity: No Food Insecurity (11/27/2023)   Received from [facility]   Epic    Within the past 12 months, you worried that your food would run out before you got the money to buy more.: Never true    Within the past 12 months, the food you bought just didn't last and you didn't have money to get more.: Never true  Transportation Needs: No  Transportation Needs (11/27/2023)   Received from [facility] - Transportation    In the past 12 months, has lack of transportation kept you from medical appointments or from getting medications?: No    Lack of Transportation (Non-Medical): No  Physical Activity: Not on file  Stress: Not on file  Social Connections: Not on file  Intimate Partner Violence: Not on file  Depression (EYV7-0): Not on file  Alcohol Screen: Not on file  Housing: Low Risk  (11/27/2023)   Received from [facility]   Epic    In the last 12 months, was there a time when you were not able to pay the mortgage or rent on time?: No  In the past 12 months, how many times have you moved where you were living?: 0    At any time in the past 12 months, were you homeless or living in a shelter (including now)?: No  Utilities: Not At Risk (11/27/2023)   Received from [facility]   Epic    In the past 12 months has the electric, gas, oil, or water company threatened to shut off services in your home?: No  Health Literacy: Not on file    Review of Systems: See HPI, otherwise negative ROS  Physical Exam: BP 138/68   Pulse (!) 50   Temp 98.4 F (36.9 C) (Temporal)   Resp 13   Ht 5' 4 (1.626 m)   Wt 70 kg   SpO2 96%   BMI 26.49 kg/m  General:   Alert,  pleasant and cooperative in NAD Head:  Normocephalic and atraumatic. Lungs:  Clear to auscultation.    Heart:  Regular rate and rhythm.   Impression/Plan: Megan Briggs is here for ophthalmic surgery.  Risks, benefits, limitations, and alternatives regarding ophthalmic surgery have been reviewed with the patient.  Questions have been answered.  All parties agreeable.   Megan GASKIN, MD  02/19/2024, 9:27 AM  "

## 2024-02-19 NOTE — Anesthesia Preprocedure Evaluation (Signed)
"                                    Anesthesia Evaluation  Patient identified by MRN, date of birth, ID band Patient awake    Reviewed: Allergy & Precautions, H&P , NPO status , Patient's Chart, lab work & pertinent test results  Airway Mallampati: II  TM Distance: >3 FB Neck ROM: Full    Dental no notable dental hx.    Pulmonary neg pulmonary ROS, former smoker   Pulmonary exam normal breath sounds clear to auscultation       Cardiovascular hypertension, negative cardio ROS Normal cardiovascular exam Rhythm:Regular Rate:Normal     Neuro/Psych negative neurological ROS  negative psych ROS   GI/Hepatic negative GI ROS, Neg liver ROS,,,  Endo/Other  negative endocrine ROS    Renal/GU negative Renal ROS  negative genitourinary   Musculoskeletal negative musculoskeletal ROS (+)    Abdominal   Peds negative pediatric ROS (+)  Hematology negative hematology ROS (+)   Anesthesia Other Findings   Reproductive/Obstetrics negative OB ROS                              Anesthesia Physical Anesthesia Plan  ASA: 3  Anesthesia Plan: General and MAC   Post-op Pain Management:    Induction: Intravenous  PONV Risk Score and Plan:   Airway Management Planned:   Additional Equipment:   Intra-op Plan:   Post-operative Plan: Extubation in OR  Informed Consent: I have reviewed the patients History and Physical, chart, labs and discussed the procedure including the risks, benefits and alternatives for the proposed anesthesia with the patient or authorized representative who has indicated his/her understanding and acceptance.     Dental advisory given  Plan Discussed with: CRNA  Anesthesia Plan Comments:         Anesthesia Quick Evaluation  "

## 2024-02-19 NOTE — Transfer of Care (Signed)
 Immediate Anesthesia Transfer of Care Note  Patient: Megan Briggs  Procedure(s) Performed: GONIOTOMY (Left: Eye)  Patient Location: PACU  Anesthesia Type: General, MAC  Level of Consciousness: awake, alert  and patient cooperative  Airway and Oxygen Therapy: Patient Spontanous Breathing and Patient connected to supplemental oxygen  Post-op Assessment: Post-op Vital signs reviewed, Patient's Cardiovascular Status Stable, Respiratory Function Stable, Patent Airway and No signs of Nausea or vomiting  Post-op Vital Signs: Reviewed and stable  Complications: No notable events documented.

## 2024-03-19 ENCOUNTER — Other Ambulatory Visit

## 2024-04-21 ENCOUNTER — Ambulatory Visit: Admitting: Oncology

## 2024-08-06 ENCOUNTER — Ambulatory Visit: Admitting: Dermatology
